# Patient Record
Sex: Male | Born: 1955 | Race: White | Hispanic: No | Smoking: Former smoker
Health system: Southern US, Community
[De-identification: ages and names within clinical notes are randomized; demographics above are authoritative.]

## PROBLEM LIST (undated history)

## (undated) DIAGNOSIS — I2510 Atherosclerotic heart disease of native coronary artery without angina pectoris: Secondary | ICD-10-CM

## (undated) DIAGNOSIS — E785 Hyperlipidemia, unspecified: Secondary | ICD-10-CM

## (undated) DIAGNOSIS — K635 Polyp of colon: Secondary | ICD-10-CM

## (undated) DIAGNOSIS — N649 Disorder of breast, unspecified: Secondary | ICD-10-CM

## (undated) DIAGNOSIS — K5790 Diverticulosis of intestine, part unspecified, without perforation or abscess without bleeding: Secondary | ICD-10-CM

## (undated) DIAGNOSIS — Z7289 Other problems related to lifestyle: Secondary | ICD-10-CM

## (undated) DIAGNOSIS — L309 Dermatitis, unspecified: Secondary | ICD-10-CM

## (undated) HISTORY — DX: Other problems related to lifestyle: Z72.89

## (undated) HISTORY — DX: Hyperlipidemia, unspecified: E78.5

## (undated) HISTORY — DX: Diverticulosis of intestine, part unspecified, without perforation or abscess without bleeding: K57.90

## (undated) HISTORY — DX: Dermatitis, unspecified: L30.9

## (undated) HISTORY — DX: Polyp of colon: K63.5

## (undated) HISTORY — DX: Disorder of breast, unspecified: N64.9

---

## 1986-09-16 HISTORY — PX: LACERATION REPAIR: SHX5168

## 1986-09-16 HISTORY — PX: EXPLORATORY LAPAROTOMY: SUR591

## 2005-06-02 ENCOUNTER — Emergency Department: Payer: Self-pay | Admitting: Emergency Medicine

## 2005-10-15 ENCOUNTER — Encounter: Payer: Self-pay | Admitting: Family Medicine

## 2005-10-15 LAB — CONVERTED CEMR LAB: TSH: 1.432 microintl units/mL

## 2005-11-11 ENCOUNTER — Ambulatory Visit: Payer: Self-pay | Admitting: Family Medicine

## 2005-11-20 ENCOUNTER — Ambulatory Visit: Payer: Self-pay | Admitting: Family Medicine

## 2005-11-20 LAB — CONVERTED CEMR LAB
Blood Glucose, Fasting: 77 mg/dL
TSH: 0.87 microintl units/mL

## 2006-07-18 ENCOUNTER — Ambulatory Visit: Payer: Self-pay | Admitting: Family Medicine

## 2006-11-10 ENCOUNTER — Ambulatory Visit: Payer: Self-pay | Admitting: Family Medicine

## 2006-11-10 LAB — CONVERTED CEMR LAB
ALT: 68 units/L — ABNORMAL HIGH (ref 0–40)
Albumin: 3.8 g/dL (ref 3.5–5.2)
Alkaline Phosphatase: 110 units/L (ref 39–117)
BUN: 14 mg/dL (ref 6–23)
Blood Glucose, Fasting: 96 mg/dL
CO2: 32 meq/L (ref 19–32)
Calcium: 9.4 mg/dL (ref 8.4–10.5)
Direct LDL: 135.2 mg/dL
GFR calc Af Amer: 114 mL/min
GFR calc non Af Amer: 95 mL/min
HDL: 67 mg/dL (ref >39.0)
Potassium: 4.5 meq/L (ref 3.5–5.1)
TSH: 1.51 microintl units/mL
TSH: 1.51 microintl units/mL (ref 0.35–5.50)
Total CHOL/HDL Ratio: 3.4
Total Protein: 6.8 g/dL (ref 6.0–8.3)
Triglycerides: 104 mg/dL (ref 0–149)
VLDL: 21 mg/dL (ref 0–40)

## 2006-11-17 ENCOUNTER — Ambulatory Visit: Payer: Self-pay | Admitting: Family Medicine

## 2006-11-24 ENCOUNTER — Ambulatory Visit: Payer: Self-pay | Admitting: Internal Medicine

## 2006-12-01 ENCOUNTER — Ambulatory Visit: Payer: Self-pay | Admitting: Internal Medicine

## 2006-12-01 DIAGNOSIS — K5730 Diverticulosis of large intestine without perforation or abscess without bleeding: Secondary | ICD-10-CM

## 2006-12-22 ENCOUNTER — Ambulatory Visit: Payer: Self-pay | Admitting: Family Medicine

## 2006-12-22 LAB — CONVERTED CEMR LAB: ALT: 36 units/L (ref 0–40)

## 2007-11-23 ENCOUNTER — Ambulatory Visit: Payer: Self-pay | Admitting: Family Medicine

## 2007-11-25 ENCOUNTER — Encounter: Payer: Self-pay | Admitting: Family Medicine

## 2007-11-25 DIAGNOSIS — J309 Allergic rhinitis, unspecified: Secondary | ICD-10-CM | POA: Insufficient documentation

## 2007-11-25 DIAGNOSIS — L259 Unspecified contact dermatitis, unspecified cause: Secondary | ICD-10-CM

## 2007-11-29 DIAGNOSIS — E7800 Pure hypercholesterolemia, unspecified: Secondary | ICD-10-CM

## 2007-11-29 LAB — CONVERTED CEMR LAB
AST: 25 units/L (ref 0–37)
Albumin: 3.9 g/dL (ref 3.5–5.2)
Alkaline Phosphatase: 92 units/L (ref 39–117)
CO2: 29 meq/L (ref 19–32)
Chloride: 106 meq/L (ref 96–112)
Cholesterol: 222 mg/dL (ref 0–200)
Creatinine, Ser: 0.8 mg/dL (ref 0.4–1.5)
HDL: 54.5 mg/dL (ref >39.0)
PSA: 1.22 ng/mL (ref 0.10–4.00)
Sodium: 140 meq/L (ref 135–145)
Total Bilirubin: 0.6 mg/dL (ref 0.3–1.2)
Total Protein: 7 g/dL (ref 6.0–8.3)
VLDL: 24 mg/dL (ref 0–40)

## 2007-12-07 ENCOUNTER — Ambulatory Visit: Payer: Self-pay | Admitting: Family Medicine

## 2008-12-05 ENCOUNTER — Ambulatory Visit: Payer: Self-pay | Admitting: Family Medicine

## 2008-12-05 LAB — CONVERTED CEMR LAB
Albumin: 3.8 g/dL (ref 3.5–5.2)
Alkaline Phosphatase: 97 units/L (ref 39–117)
Basophils Relative: 0.6 % (ref 0.0–3.0)
Bilirubin, Direct: 0 mg/dL (ref 0.0–0.3)
Calcium: 9.1 mg/dL (ref 8.4–10.5)
Eosinophils Absolute: 0.6 10*3/uL (ref 0.0–0.7)
GFR calc non Af Amer: 107.42 mL/min (ref >60)
HCT: 46.9 % (ref 39.0–52.0)
HDL: 49.7 mg/dL (ref >39.00)
Hemoglobin: 16 g/dL (ref 13.0–17.0)
Lymphocytes Relative: 31.2 % (ref 12.0–46.0)
MCHC: 34.2 g/dL (ref 30.0–36.0)
MCV: 93.6 fL (ref 78.0–100.0)
Neutro Abs: 4.1 10*3/uL (ref 1.4–7.7)
RBC: 5.01 M/uL (ref 4.22–5.81)
Sodium: 143 meq/L (ref 135–145)
Total Bilirubin: 0.7 mg/dL (ref 0.3–1.2)
Total CHOL/HDL Ratio: 4
VLDL: 18.8 mg/dL (ref 0.0–40.0)

## 2008-12-12 ENCOUNTER — Ambulatory Visit: Payer: Self-pay | Admitting: Family Medicine

## 2008-12-12 DIAGNOSIS — R7309 Other abnormal glucose: Secondary | ICD-10-CM | POA: Insufficient documentation

## 2009-07-31 ENCOUNTER — Ambulatory Visit: Payer: Self-pay | Admitting: Family Medicine

## 2009-07-31 DIAGNOSIS — K5732 Diverticulitis of large intestine without perforation or abscess without bleeding: Secondary | ICD-10-CM

## 2009-07-31 LAB — CONVERTED CEMR LAB
Bacteria, UA: 0
Glucose, Urine, Semiquant: NEGATIVE
Nitrite: NEGATIVE
RBC / HPF: 0
Specific Gravity, Urine: 1.015

## 2009-08-07 ENCOUNTER — Ambulatory Visit: Payer: Self-pay | Admitting: Family Medicine

## 2009-12-11 ENCOUNTER — Ambulatory Visit: Payer: Self-pay | Admitting: Family Medicine

## 2009-12-11 LAB — CONVERTED CEMR LAB
Alkaline Phosphatase: 84 units/L (ref 39–117)
Basophils Relative: 0.9 % (ref 0.0–3.0)
Bilirubin, Direct: 0 mg/dL (ref 0.0–0.3)
CO2: 29 meq/L (ref 19–32)
Calcium: 9.3 mg/dL (ref 8.4–10.5)
Creatinine,U: 143.9 mg/dL
Direct LDL: 140.7 mg/dL
Eosinophils Absolute: 0.5 10*3/uL (ref 0.0–0.7)
Eosinophils Relative: 6.4 % — ABNORMAL HIGH (ref 0.0–5.0)
GFR calc non Af Amer: 93.4 mL/min (ref >60)
HDL: 59.5 mg/dL (ref >39.00)
Lymphocytes Relative: 25 % (ref 12.0–46.0)
Microalb, Ur: 1.1 mg/dL (ref 0.0–1.9)
Neutrophils Relative %: 59.4 % (ref 43.0–77.0)
RBC: 4.99 M/uL (ref 4.22–5.81)
Sodium: 141 meq/L (ref 135–145)
Total CHOL/HDL Ratio: 4
VLDL: 17 mg/dL (ref 0.0–40.0)
WBC: 8.4 10*3/uL (ref 4.5–10.5)

## 2009-12-18 ENCOUNTER — Ambulatory Visit: Payer: Self-pay | Admitting: Family Medicine

## 2010-04-24 ENCOUNTER — Encounter (INDEPENDENT_AMBULATORY_CARE_PROVIDER_SITE_OTHER): Payer: Self-pay | Admitting: *Deleted

## 2010-10-16 NOTE — Letter (Signed)
Summary: Nadara Eaton letter  [place] [facility]  [address] [place], [place] [address]   Phone: [phone]  Fax: [phone]       04/24/2010 MRN: [id number]  [facility] [address] [place], [place]  [address]  Dear Mr. [facility] [place] and [place] announce the retirement of Arta Silence, M.D., from full-time practice at the [facility] office effective March 15, 2010 and his plans of returning part-time.  It is important to Dr. Hetty Ely and to our practice that you understand that [facility] has seven physicians in our office for your health care needs.  We will continue to offer the same exceptional care that you have today.    Dr. Hetty Ely has spoken to many of you about his plans for retirement and returning part-time in the fall.   We will continue to work with you through the transition to schedule appointments for you in the office and meet the high standards that [employer] is committed to.   Again, it is with great pleasure that we share the news that Dr. Hetty Ely will return to [facility] at [facility] in October of 2011 with a reduced schedule.    If you have any questions, or would like to request an appointment with one of our physicians, please call us at [phone] and press the option for Scheduling an appointment.  We take pleasure in providing you with excellent patient care and look forward to seeing you at your next office visit.  Our [facility] Physicians are:  Tillman Abide, M.D. Laurita Quint, M.D. Roxy Manns, M.D. Kerby Nora, M.D. Hannah Beat, M.D. Ruthe Mannan, M.D. We proudly welcomed Raechel Ache, M.D. and Eustaquio Boyden, M.D. to the practice in July/August 2011.  Sincerely,  [facility]

## 2010-10-16 NOTE — Assessment & Plan Note (Signed)
Summary: CPX//CYD   Vital Signs:  Patient profile:   55 year old male Weight:      205.75 pounds Temp:     98.4 degrees F oral Pulse rate:   84 / minute Pulse rhythm:   regular BP sitting:   122 / 82  (left arm) Cuff size:   regular  Vitals Entered By: Sydell Axon LPN (December 18, 1608 2:59 PM) CC: 30 Minute checkup, had a colonoscopy in [facility] 03/08   History of Present Illness: Pt here with his wife for Comp Exam. He is currently having LBP from doing too much yesterday.  He has had back discomfort from falling out of a tree when young.  Preventive Screening-Counseling & Management  Alcohol-Tobacco     Alcohol drinks/day: 2     Alcohol type: beer     Smoking Status: quit     Packs/Day: 1994     Year Quit: 1994     Pack years: 20     Passive Smoke Exposure: no  Caffeine-Diet-Exercise     Caffeine use/day: 1     Does Patient Exercise: no     Type of exercise: walks     Times/week: 5  Allergies: 1)  ! * Prednisone 2)  ! 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, ARM, BACKX2 (CONVENIENCE STORE CLERK) (DR Katrinka Blazing) 1988 COLONOSCOPY-- DIVERTICULITIS:(12/01/2006)  Family History: Last updated: 06-Jan-2010 Father: DECEASED 36  BLADDER CANCER/ DM/ CVA Mother: ALIVE  65   HEART DISEASE , MULTIPLE CVA; MI PACER BROTHER EDDIE A 61  ELEVATED CHOLESTEROL  MI BROTHER HALEY A ELEVATED CHOL  CV: +MOTHER MULTIPLE HBP: ?SELF DM: + FATHER GOUT/ARTHRITIS  PROSTATE CANCER: NEGATIVE + BLADDER CANCER FATHER BREAST/OVARIAN/UTERINE CANCER:  COLON CANCER: + METAS TO COLON FATHER DEPRESSION : NEGATIVE ETOH/DRUG ABUSE: NEGATIVE OTHER: + STROKE MOTHER/FATHER  Social History: Last updated: 12/12/2008 Marital Status: Married/Divorced 1997  Children: 2 CHILDREN// 4 GRANDCHILDREN Occupation: HVAC WORK  Risk Factors: Alcohol Use: 2  (January 06, 2010) Caffeine Use: 1 (2010-01-06) Exercise: no (01-06-10)  Risk Factors: Smoking Status: quit (2010/01/06) Packs/Day: 1994 (2010-01-06) Passive Smoke Exposure: no (01-06-2010)  Family History: Father: DECEASED 38  BLADDER CANCER/ DM/ CVA Mother: ALIVE  64   HEART DISEASE , MULTIPLE CVA; MI PACER BROTHER EDDIE A 61  ELEVATED CHOLESTEROL  MI BROTHER HALEY A ELEVATED CHOL  CV: +MOTHER MULTIPLE HBP: ?SELF DM: + FATHER GOUT/ARTHRITIS  PROSTATE CANCER: NEGATIVE + BLADDER CANCER FATHER BREAST/OVARIAN/UTERINE CANCER:  COLON CANCER: + METAS TO COLON FATHER DEPRESSION : NEGATIVE ETOH/DRUG ABUSE: NEGATIVE OTHER: + STROKE MOTHER/FATHER  Social History: Does Patient Exercise:  no  Review of Systems General:  Denies chills, fatigue, fever, sweats, weakness, and weight loss. Eyes:  Complains of blurring; denies discharge and eye pain; occas blurry. ENT:  Complains of decreased hearing; denies earache and ringing in ears. CV:  Denies chest pain or discomfort, fainting, fatigue, shortness of breath with exertion, swelling of feet, and swelling of hands. Resp:  Denies cough, shortness of breath, and wheezing. GI:  Denies abdominal pain, bloody stools, change in bowel habits, constipation, dark tarry stools, diarrhea, indigestion, loss of appetite, nausea, vomiting, vomiting blood, and yellowish skin color. GU:  Denies discharge, dysuria, nocturia, and urinary frequency. MS:  Complains of low back pain; denies muscle aches, cramps, and stiffness; chronic. Derm:  Denies dryness, itching, and rash. Neuro:  Denies numbness, poor balance, tingling, and  tremors.  Physical Exam  General:  Well-developed,well-nourished,in no acute distress; alert,appropriate and cooperative throughout examination, minimally overweight.. Head:  Normocephalic and atraumatic without obvious abnormalities. No apparent alopecia or balding. Sinuses NT. Eyes:  Conjunctiva clear bilaterally.  Ears:  External  ear exam shows no significant lesions or deformities.  Otoscopic examination reveals clear canals, tympanic membranes are intact bilaterally without bulging, retraction, inflammation or discharge. Hearing is grossly normal bilaterally. Nose:  External nasal examination shows no deformity or inflammation. Nasal mucosa are pink and moist without lesions or exudates. Mouth:  Oral mucosa and oropharynx without lesions or exudates.  Teeth in good repair. Neck:  No deformities, masses, or tenderness noted. Chest Wall:  No deformities, masses, tenderness or gynecomastia noted. Breasts:  No masses or gynecomastia noted Lungs:  Normal respiratory effort, chest expands symmetrically. Lungs are clear to auscultation, no crackles or wheezes. Heart:  Normal rate and regular rhythm. S1 and S2 normal without gallop, murmur, click, rub or other extra sounds. Abdomen:  Bowel sounds positive,abdomen soft and non-tender generally  without masses, organomegaly or hernias noted. Well healed vertical abd incision. Mildly protuberant abdomen. Rectal:  No external abnormalities noted. Normal sphincter tone. No rectal masses or tenderness. G neg. Genitalia:  Testes bilaterally descended without nodularity, tenderness or masses. No scrotal masses or lesions. No penis lesions or urethral discharge. Prostate:  Prostate gland firm and smooth, no enlargement, nodularity, tenderness, mass, asymmetry or induration. 20gms. Nonboggy. Msk:  No deformity or scoliosis noted of thoracic or lumbar spine.   Pulses:  R and L carotid,radial,femoral,dorsalis pedis and posterior tibial pulses are full and equal bilaterally Extremities:  No clubbing, cyanosis, edema, or deformity noted with normal full range of motion of all joints.   Neurologic:  No cranial nerve deficits noted. Station and gait are normal. Sensory, motor and coordinative functions appear intact. Skin:  Intact without suspicious lesions or rashes, benign AKs of the shoulder  areas. Cervical Nodes:  No lymphadenopathy noted Inguinal Nodes:  No significant adenopathy Psych:  Cognition and judgment appear intact. Alert and cooperative with normal attention span and concentration. No apparent delusions, illusions, hallucinations   Impression & Recommendations:  Problem # 1:  HEALTH MAINTENANCE EXAM (ICD-V70.0)  Reviewed preventive care protocols, scheduled due services, and updated immunizations. Discussed diet and exercise and healthmaintenance tips. Immuns UTD. Colonoscopy UTD. Avoid sweetsd and carbs and fatty foods as discussed.  Problem # 2:  DIVERTICULITIS OF COLON (ICD-562.11) Assessment: Improved  Back to normal, pain gone and is avopiding seeds and nuts which has made big diff for him.  Colonoscopy:  Labs Reviewed: Hgb: 15.6 (12/11/2009)   Hct: 47.2 (12/11/2009)   WBC: 8.4 (12/11/2009)  Problem # 3:  HYPERGLYCEMIA (ICD-790.29) Assessment: Improved Euglycemic today altho high 90s. Discussed diet and exercisr. Labs Reviewed:eCreat: 0.9 (12/11/2009)     Problem # 4:  HYPERCHOLESTEROLEMIA (ICD-272.0) Assessment: Unchanged LDL slightly high. He can do better and says he will.  Labs Reviewed: SGOT: 25 (12/11/2009)   SGPT: 49 (12/11/2009)   HDL:59.50 (12/11/2009), 49.70 (12/05/2008)  LDL:119 (12/05/2008), DEL (11/23/2007)  Chol:210 (12/11/2009), 187 (12/05/2008)  Trig:85.0 (12/11/2009), 94.0 (12/05/2008)  Patient Instructions: 1)  RTC one year, sooner as needed.  2)  Che ck PSA again next year.  Prior Medications (reviewed today): None Current Allergies (reviewed today): ! * PREDNISONE ! * CODEINE

## 2010-10-25 ENCOUNTER — Encounter: Payer: Self-pay | Admitting: Family Medicine

## 2010-10-31 ENCOUNTER — Ambulatory Visit (INDEPENDENT_AMBULATORY_CARE_PROVIDER_SITE_OTHER): Payer: BC Managed Care – PPO | Admitting: Family Medicine

## 2010-10-31 ENCOUNTER — Encounter: Payer: Self-pay | Admitting: Family Medicine

## 2010-10-31 ENCOUNTER — Other Ambulatory Visit: Payer: Self-pay | Admitting: Family Medicine

## 2010-10-31 DIAGNOSIS — K5732 Diverticulitis of large intestine without perforation or abscess without bleeding: Secondary | ICD-10-CM

## 2010-10-31 LAB — HEPATIC FUNCTION PANEL
ALT: 31 U/L (ref 0–53)
AST: 25 U/L (ref 0–37)
Alkaline Phosphatase: 107 U/L (ref 39–117)
Bilirubin, Direct: 0.1 mg/dL (ref 0.0–0.3)
Total Bilirubin: 1 mg/dL (ref 0.3–1.2)

## 2010-10-31 LAB — BASIC METABOLIC PANEL
BUN: 14 mg/dL (ref 6–23)
Calcium: 9.2 mg/dL (ref 8.4–10.5)
GFR: 99.44 mL/min (ref >60.00)
Glucose, Bld: 87 mg/dL (ref 70–99)
Potassium: 4.4 mEq/L (ref 3.5–5.1)
Sodium: 140 mEq/L (ref 135–145)

## 2010-10-31 LAB — CONVERTED CEMR LAB
Nitrite: NEGATIVE
Protein, U semiquant: 30
Urobilinogen, UA: 0.2
WBC Urine, dipstick: NEGATIVE
pH: 7.5

## 2010-11-01 LAB — CBC WITH DIFFERENTIAL/PLATELET
Basophils Absolute: 0.1 10*3/uL (ref 0.0–0.1)
Eosinophils Relative: 3.1 % (ref 0.0–5.0)
Lymphocytes Relative: 13.7 % (ref 12.0–46.0)
Monocytes Relative: 9.5 % (ref 3.0–12.0)
Platelets: 252 10*3/uL (ref 150.0–400.0)
RDW: 13.9 % (ref 11.5–14.6)
WBC: 11.4 10*3/uL — ABNORMAL HIGH (ref 4.5–10.5)

## 2010-11-07 NOTE — Assessment & Plan Note (Signed)
Summary: ? DIVERTICULITIS   Vital Signs:  Patient profile:   55 year old male Weight:      193.25 pounds BMI:     29.49 Temp:     98.3 degrees F oral Pulse rate:   88 / minute Pulse rhythm:   regular BP sitting:   116 / 80  (left arm) Cuff size:   large  Vitals Entered By: Selena Batten Dance CMA (AAMA) (October 31, 2010 8:55 AM) CC: ? Diverticulitis Comments lower abd pain x1 1/2 days   History of Present Illness: CC: abd pain  3 d h/o lower abdominal pain, intermittent, sharp pain suprapubically.  Pain lasts 30 sec.  Appetite still present.  Last meal was 6pm yesterday, last BM was yesterday.    No fevers/chills, nausea, vomiting, diarrhea, stool changes.  Voiding fine, no burning, no polyuria.  Complaining of hemorrhoid pain x 1 mo.  Did have popcorn the other night (hadn't had in a long time)  has had this in past, last time was 2 years ago feels same, treated as diverticulitis and improved.  no abd surgeries.  h/o colonoscopy 2008 with diverticulitis per chart.  Current Medications (verified): 1)  None  Allergies: 1)  ! * Prednisone 2)  ! 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 Mouth:  Oral mucosa and oropharynx without lesions or exudates.  Teeth in good repair.  MMM Lungs:  Normal respiratory effort, chest expands symmetrically. Lungs are clear to auscultation, no crackles or  wheezes. Heart:  Normal rate and regular rhythm. S1 and S2 normal without gallop, murmur, click, rub or other extra sounds. Abdomen:  Bowel sounds positive,abdomen soft and without masses, organomegaly or hernias noted. Well healed vertical abd incision. Mildly protuberant abdomen.  + tender to palpation LLQ mainly but also suprapubic region. Genitalia:  no inguinal hernias appreciated today Pulses:  2+ rad pulses, brisk cap refill Extremities:  no pedal edema   Impression & Recommendations:  Problem # 1:  DIVERTICULITIS OF COLON (ICD-562.11) Assessment New  leading differential is rpt diverticulitis in pt with h/o same and similar presentation 2 years ago. check CBC (would expect mild WBC elevation).  check LFTs to r/o other intraabd process. UA micro reassuring. discussed clear liquid diet for next 1-2 days then slowly ADAT. treat with abx x2 to prevent complications.  had colonscopy 2008, WNL x divertic.  if flare recurrent, refer back to GI for sooner eval. if not improving or deteriorating, low threshold for abd/pelvic CT w contrast.  no hernias evident today.    Orders: UA Dipstick W/ Micro (manual) (40981) Venipuncture (19147) TLB-BMP (Basic Metabolic Panel-BMET) (80048-METABOL) TLB-CBC Platelet - w/Differential (85025-CBCD) TLB-Hepatic/Liver Function Pnl (80076-HEPATIC)  Colonoscopy:  2008 WNL  Labs Reviewed: Hgb: 15.6 (12/11/2009)   Hct: 47.2 (12/11/2009)   WBC: 8.4 (12/11/2009)  Complete Medication List: 1)  Ciprofloxacin Hcl 500 Mg Tabs (Ciprofloxacin hcl) .... Take one twice daily for 10 days 2)  Metronidazole 500 Mg Tabs (Metronidazole) .... One by mouth three times a day x 10 days  Patient Instructions: 1)  Call [facility] to see when next colonoscopy due. 2)  I think you have some diverticulitis.  Treat with antibiotics for 10 days. 3)  Change to broth/liquid diet for next several days and then slowly advance diet (bland foods). 4)  If fevers, worsening or not  improving as expected, please let us know.  We may pursue further imaging if not improving as expected. 5)  Blood work today to check things. Prescriptions: METRONIDAZOLE 500 MG TABS (METRONIDAZOLE) one by mouth three times a day x 10 days  #30 x 0   Entered and Authorized by:   Eustaquio Boyden  MD   Signed by:   Eustaquio Boyden  MD on 10/31/2010   Method used:   Electronically to        [id number] (retail)       [address]       [place], [place]  [address]       Ph: [phone]       Fax: [phone]   RxID:   [id number] CIPROFLOXACIN HCL 500 MG TABS (CIPROFLOXACIN HCL) take one twice daily for 10 days  #20 x 0   Entered and Authorized by:   Eustaquio Boyden  MD   Signed by:   Eustaquio Boyden  MD on 10/31/2010   Method used:   Electronically to        [id number] (retail)       [address]       [place], [place]  [address]       Ph: [phone]       Fax: [phone]   RxID:   [id number]    Orders Added: 1)  UA Dipstick W/ Micro (manual) [81000] 2)  Venipuncture [48546] 3)  TLB-BMP (Basic Metabolic Panel-BMET) [80048-METABOL] 4)  TLB-CBC Platelet - w/Differential [85025-CBCD] 5)  TLB-Hepatic/Liver Function Pnl [80076-HEPATIC] 6)  Est. Patient Level III [[address]]    Prior Medications: Current Allergies (reviewed today): ! * PREDNISONE ! * CODEINE  Laboratory Results   Urine Tests  Date/Time Received: October 31, 2010 9:03 AM  Date/Time Reported: October 31, 2010 9:03 AM   Routine Urinalysis   Color: amber Appearance: Clear Glucose: negative   (Normal Range: Negative) Bilirubin: negative   (Normal Range: Negative) Ketone: small (15)   (Normal Range: Negative) Spec. Gravity: 1.015   (Normal Range: 1.003-1.035) Blood: trace-lysed   (Normal Range: Negative) pH: 7.5   (Normal Range: 5.0-8.0) Protein: 30   (Normal Range: Negative) Urobilinogen: 0.2   (Normal Range: 0-1) Nitrite: negative   (Normal Range:  Negative) Leukocyte Esterace: negative   (Normal Range: Negative)  Urine Microscopic WBC/HPF: 1-5 RBC/HPF: 0-3 Bacteria/HPF: tr Mucous/HPF: large Epithelial/HPF: rare Crystals/HPF: no Casts/LPF: no Yeast/HPF: no    Comments: read by .........Eustaquio Boyden  MD  October 31, 2010 9:29 AM  no UCx sent.

## 2010-12-13 ENCOUNTER — Other Ambulatory Visit: Payer: Self-pay | Admitting: Family Medicine

## 2010-12-13 DIAGNOSIS — R7309 Other abnormal glucose: Secondary | ICD-10-CM

## 2010-12-13 DIAGNOSIS — K5730 Diverticulosis of large intestine without perforation or abscess without bleeding: Secondary | ICD-10-CM

## 2010-12-13 DIAGNOSIS — E7800 Pure hypercholesterolemia, unspecified: Secondary | ICD-10-CM

## 2010-12-13 DIAGNOSIS — Z125 Encounter for screening for malignant neoplasm of prostate: Secondary | ICD-10-CM

## 2010-12-17 ENCOUNTER — Other Ambulatory Visit (INDEPENDENT_AMBULATORY_CARE_PROVIDER_SITE_OTHER): Payer: BC Managed Care – PPO | Admitting: Family Medicine

## 2010-12-17 DIAGNOSIS — K5730 Diverticulosis of large intestine without perforation or abscess without bleeding: Secondary | ICD-10-CM

## 2010-12-17 DIAGNOSIS — R7309 Other abnormal glucose: Secondary | ICD-10-CM

## 2010-12-17 DIAGNOSIS — E7800 Pure hypercholesterolemia, unspecified: Secondary | ICD-10-CM

## 2010-12-17 DIAGNOSIS — Z125 Encounter for screening for malignant neoplasm of prostate: Secondary | ICD-10-CM

## 2010-12-17 LAB — CBC WITH DIFFERENTIAL/PLATELET
Basophils Absolute: 0.1 10*3/uL (ref 0.0–0.1)
Eosinophils Absolute: 0.4 10*3/uL (ref 0.0–0.7)
Lymphocytes Relative: 17 % (ref 12.0–46.0)
MCHC: 33.9 g/dL (ref 30.0–36.0)
Neutrophils Relative %: 71.6 % (ref 43.0–77.0)
RDW: 14 % (ref 11.5–14.6)

## 2010-12-17 LAB — BASIC METABOLIC PANEL
CO2: 26 mEq/L (ref 19–32)
Calcium: 8.9 mg/dL (ref 8.4–10.5)
Creatinine, Ser: 0.5 mg/dL (ref 0.4–1.5)
Glucose, Bld: 101 mg/dL — ABNORMAL HIGH (ref 70–99)

## 2010-12-17 LAB — LIPID PANEL
Cholesterol: 216 mg/dL — ABNORMAL HIGH (ref 0–200)
Total CHOL/HDL Ratio: 4
Triglycerides: 81 mg/dL (ref 0.0–149.0)
VLDL: 16.2 mg/dL (ref 0.0–40.0)

## 2010-12-17 LAB — HEPATIC FUNCTION PANEL
Albumin: 3.7 g/dL (ref 3.5–5.2)
Alkaline Phosphatase: 98 U/L (ref 39–117)
Bilirubin, Direct: 0.1 mg/dL (ref 0.0–0.3)

## 2010-12-20 ENCOUNTER — Encounter: Payer: Self-pay | Admitting: Family Medicine

## 2010-12-20 ENCOUNTER — Ambulatory Visit (INDEPENDENT_AMBULATORY_CARE_PROVIDER_SITE_OTHER): Payer: BC Managed Care – PPO | Admitting: Family Medicine

## 2010-12-20 DIAGNOSIS — M7041 Prepatellar bursitis, right knee: Secondary | ICD-10-CM | POA: Insufficient documentation

## 2010-12-20 DIAGNOSIS — M7040 Prepatellar bursitis, unspecified knee: Secondary | ICD-10-CM

## 2010-12-20 DIAGNOSIS — E7800 Pure hypercholesterolemia, unspecified: Secondary | ICD-10-CM

## 2010-12-20 DIAGNOSIS — K5732 Diverticulitis of large intestine without perforation or abscess without bleeding: Secondary | ICD-10-CM

## 2010-12-20 DIAGNOSIS — R7309 Other abnormal glucose: Secondary | ICD-10-CM

## 2010-12-20 DIAGNOSIS — K5730 Diverticulosis of large intestine without perforation or abscess without bleeding: Secondary | ICD-10-CM

## 2010-12-20 DIAGNOSIS — J309 Allergic rhinitis, unspecified: Secondary | ICD-10-CM

## 2010-12-20 NOTE — Progress Notes (Signed)
  Subjective:    Patient ID: Adam Daniels, male    DOB: May 10, 1956, 55 y.o.   MRN: [id number]  HPI Pt here with wife for COMP Exam. He woke up SUN AM with his right knee acutely swollen with discomfort. He otherwise feels well with no other complaints.    Review of Systems  Constitutional: Negative for fever, chills, diaphoresis, appetite change, fatigue and unexpected weight change.  HENT: Negative for hearing loss, ear pain, tinnitus and ear discharge.   Eyes: Negative for pain, discharge and visual disturbance.  Respiratory: Positive for cough (in AM after Listerine). Negative for shortness of breath and wheezing.   Cardiovascular: Negative for chest pain and palpitations.       [No SOB w/ exertion Gastrointestinal: Negative for nausea, vomiting, abdominal pain, diarrhea, constipation and blood in stool.       [No heartburn or swallowing problems. Genitourinary: Negative for dysuria, frequency and difficulty urinating.       [No nocturia Musculoskeletal: Negative for myalgias, back pain and arthralgias.  Skin: Negative for rash.       [No itching but  dryness. Neurological: Negative for tremors and numbness.       [No tingling or balance problems. Hematological: Negative for adenopathy. Does not bruise/bleed easily.  Psychiatric/Behavioral: Negative for dysphoric mood and agitation.       Objective:   Physical Exam  Constitutional: He is oriented to person, place, and time. He appears well-developed and well-nourished. No distress.  HENT:  Head: Normocephalic and atraumatic.  Right Ear: External ear normal.  Left Ear: External ear normal.  Nose: Nose normal.  Mouth/Throat: Oropharynx is clear and moist.  Eyes: Conjunctivae and EOM are normal. Pupils are equal, round, and reactive to light. Right eye exhibits no discharge. Left eye exhibits no discharge. No scleral icterus.  Neck: Normal range of motion. Neck supple. No thyromegaly present.  Cardiovascular: Normal rate,  regular rhythm, normal heart sounds and intact distal pulses.   No murmur heard. Pulmonary/Chest: Effort normal and breath sounds normal. No respiratory distress. He has no wheezes.  Abdominal: Soft. Bowel sounds are normal. He exhibits no distension and no mass. There is no tenderness. There is no rebound and no guarding.  Genitourinary: Rectum normal, prostate normal and penis normal. Guaiac negative stool.       Prostate 20gms  Musculoskeletal: Normal range of motion. He exhibits no edema.       R knee with bursa mildly swollen without erythema and warmth. Minimal tenderness to palpation and no broken skin evident.  Lymphadenopathy:    He has no cervical adenopathy.  Neurological: He is alert and oriented to person, place, and time. Coordination normal.  Skin: Skin is warm and dry. No rash noted. He is not diaphoretic.  Psychiatric: He has a normal mood and affect. His behavior is normal. Judgment and thought content normal.          Assessment & Plan:  Comp Exam   Normal.

## 2010-12-20 NOTE — Assessment & Plan Note (Signed)
Acute. Discussed taking Aleve 2 after brfst and supper for 2 weeks.

## 2010-12-20 NOTE — Assessment & Plan Note (Addendum)
LDL slightly high, o/w good. Do not think worth starting med at this point. Avoid fatty foods. Was discussed. Lab Results  Component Value Date   CHOL 216* 12/17/2010   CHOL 210* 12/11/2009   CHOL 187 12/05/2008   Lab Results  Component Value Date   HDL 57.90 12/17/2010   HDL 59.50 12/11/2009   HDL 09.81 12/05/2008   Lab Results  Component Value Date   LDLCALC 119* 12/05/2008   Lab Results  Component Value Date   TRIG 81.0 12/17/2010   TRIG 85.0 12/11/2009   TRIG 94.0 12/05/2008   Lab Results  Component Value Date   CHOLHDL 4 12/17/2010   CHOLHDL 4 12/11/2009   CHOLHDL 4 12/05/2008

## 2010-12-20 NOTE — Assessment & Plan Note (Signed)
Discussed being seen for prolonged LLQ discomfort..he now understands why.

## 2010-12-20 NOTE — Assessment & Plan Note (Signed)
Essentially resolved. 

## 2010-12-20 NOTE — Assessment & Plan Note (Addendum)
Minimally elevated. Discussed avoiding sweets and carbs....given specifics. Start ASA 81mg  per day.

## 2010-12-20 NOTE — Assessment & Plan Note (Signed)
Stable and well controlled. 

## 2011-11-15 HISTORY — PX: COLONOSCOPY: SHX174

## 2011-11-20 ENCOUNTER — Encounter: Payer: Self-pay | Admitting: Internal Medicine

## 2011-11-21 ENCOUNTER — Ambulatory Visit (AMBULATORY_SURGERY_CENTER): Payer: BC Managed Care – PPO | Admitting: *Deleted

## 2011-11-21 ENCOUNTER — Encounter: Payer: Self-pay | Admitting: Internal Medicine

## 2011-11-21 VITALS — Ht 67.0 in | Wt 199.5 lb

## 2011-11-21 DIAGNOSIS — Z1211 Encounter for screening for malignant neoplasm of colon: Secondary | ICD-10-CM

## 2011-11-21 MED ORDER — PEG-KCL-NACL-NASULF-NA ASC-C 100 G PO SOLR: ORAL | Status: DC

## 2011-12-05 ENCOUNTER — Ambulatory Visit (AMBULATORY_SURGERY_CENTER): Payer: BC Managed Care – PPO | Admitting: Internal Medicine

## 2011-12-05 ENCOUNTER — Encounter: Payer: Self-pay | Admitting: Internal Medicine

## 2011-12-05 VITALS — BP 118/90 | HR 64 | Temp 96.4°F | Resp 14 | Ht 67.0 in | Wt 199.0 lb

## 2011-12-05 DIAGNOSIS — Z800 Family history of malignant neoplasm of digestive organs: Secondary | ICD-10-CM

## 2011-12-05 DIAGNOSIS — D126 Benign neoplasm of colon, unspecified: Secondary | ICD-10-CM

## 2011-12-05 DIAGNOSIS — Z1211 Encounter for screening for malignant neoplasm of colon: Secondary | ICD-10-CM

## 2011-12-05 DIAGNOSIS — K5730 Diverticulosis of large intestine without perforation or abscess without bleeding: Secondary | ICD-10-CM

## 2011-12-05 MED ORDER — SODIUM CHLORIDE 0.9 % IV SOLN: 500.0000 mL | INTRAVENOUS | Status: DC

## 2011-12-05 NOTE — Progress Notes (Signed)
Patient did not experience any of the following events: a burn prior to discharge; a fall within the facility; wrong site/side/patient/procedure/implant event; or a hospital transfer or hospital admission upon discharge from the facility. (G8907) Patient did not have preoperative order for IV antibiotic SSI prophylaxis. (G8918)  

## 2011-12-05 NOTE — Op Note (Signed)
[facility] [address] [place], [place]  [address]  COLONOSCOPY PROCEDURE REPORT  PATIENT:  Adam Daniels, Adam Daniels  MR#:  [id number] BIRTHDATE:  [birth year], 56 yrs. old  GENDER:  male ENDOSCOPIST:  Iva Boop, MD, [facility] REF. BY: PROCEDURE DATE:  12/05/2011 PROCEDURE:  Colonoscopy with snare polypectomy ASA CLASS:  Class I INDICATIONS:  Elevated Risk Screening, family history of colon cancer father had colon cancer (colostomy and chemotx) in early 60's MEDICATIONS:   These medications were titrated to patient response per physician's verbal order, Fentanyl 75 mcg IV, Versed 7 mg IV  DESCRIPTION OF PROCEDURE:   After the risks benefits and alternatives of the procedure were thoroughly explained, informed consent was obtained.  Digital rectal exam was performed and revealed no abnormalities and normal prostate.   The LB [id number]-[id number] endoscope was introduced through the anus and advanced to the cecum, which was identified by both the appendix (photo omitted) and ileocecal valve, without limitations.  The patient was moved supine to fully enter cecum. The quality of the prep was good, using MoviPrep.  The instrument was then slowly withdrawn as the colon was fully examined. <<PROCEDUREIMAGES>>  FINDINGS:  Two polyps were found in the transverse colon. They were diminutive. Polyps were snared without cautery. Retrieval was successful.  Moderate diverticulosis was found in the left colon. This was otherwise a normal examination of the colon.   Retroflexed views in the rectum revealed no abnormalities.    The time to cecum = 2:52 minutes. The scope was then withdrawn in 13:28 minutes from the cecum and the procedure completed. COMPLICATIONS:  None ENDOSCOPIC IMPRESSION: 1) Two polyps in the transverse colon - diminutive and removed 2) Moderate diverticulosis in the left colon 3) Otherwise normal examination, good prep,  with family hx colon cancer  REPEAT EXAM:  In  for Colonoscopy, pending biopsy results.  Iva Boop, MD, [facility]  CC:  The Patient  n. eSIGNED:   Iva Boop at 12/05/2011 09:50 AM  Pamalee Leyden, [phone]

## 2011-12-05 NOTE — Patient Instructions (Signed)

## 2011-12-06 ENCOUNTER — Telehealth: Payer: Self-pay | Admitting: *Deleted

## 2011-12-06 NOTE — Telephone Encounter (Signed)
  Follow up Call-  Call back number 12/05/2011  Post procedure Call Back phone  # [phone]  Permission to leave phone message Yes     Patient questions:  Do you have a fever, pain , or abdominal swelling? no Pain Score  0 *  Have you tolerated food without any problems? yes  Have you been able to return to your normal activities? yes  Do you have any questions about your discharge instructions: Diet   no Medications  no Follow up visit  no  Do you have questions or concerns about your Care? no  Actions: * If pain score is 4 or above: No action needed, pain <4.

## 2011-12-11 ENCOUNTER — Encounter: Payer: Self-pay | Admitting: Internal Medicine

## 2011-12-11 ENCOUNTER — Encounter: Payer: Self-pay | Admitting: Family Medicine

## 2011-12-11 DIAGNOSIS — Z860100 Personal history of colon polyps, unspecified: Secondary | ICD-10-CM | POA: Insufficient documentation

## 2011-12-11 NOTE — Progress Notes (Signed)
Quick Note:  Diminutive adenoma and Fhx CRCA Repeat colon about 11/2016 ______

## 2011-12-20 ENCOUNTER — Other Ambulatory Visit: Payer: Self-pay | Admitting: Family Medicine

## 2011-12-20 DIAGNOSIS — Z125 Encounter for screening for malignant neoplasm of prostate: Secondary | ICD-10-CM

## 2011-12-20 DIAGNOSIS — E7800 Pure hypercholesterolemia, unspecified: Secondary | ICD-10-CM

## 2011-12-23 ENCOUNTER — Other Ambulatory Visit (INDEPENDENT_AMBULATORY_CARE_PROVIDER_SITE_OTHER): Payer: BC Managed Care – PPO

## 2011-12-23 DIAGNOSIS — Z125 Encounter for screening for malignant neoplasm of prostate: Secondary | ICD-10-CM

## 2011-12-23 DIAGNOSIS — E7800 Pure hypercholesterolemia, unspecified: Secondary | ICD-10-CM

## 2011-12-23 LAB — BASIC METABOLIC PANEL
Chloride: 106 mEq/L (ref 96–112)
GFR: 89.27 mL/min (ref >60.00)
Potassium: 4.2 mEq/L (ref 3.5–5.1)
Sodium: 141 mEq/L (ref 135–145)

## 2011-12-23 LAB — LIPID PANEL
Cholesterol: 207 mg/dL — ABNORMAL HIGH (ref 0–200)
HDL: 52.6 mg/dL (ref >39.00)
Triglycerides: 88 mg/dL (ref 0.0–149.0)

## 2011-12-26 ENCOUNTER — Encounter: Payer: Self-pay | Admitting: Family Medicine

## 2011-12-26 ENCOUNTER — Ambulatory Visit (INDEPENDENT_AMBULATORY_CARE_PROVIDER_SITE_OTHER): Payer: BC Managed Care – PPO | Admitting: Family Medicine

## 2011-12-26 ENCOUNTER — Encounter: Payer: BC Managed Care – PPO | Admitting: Family Medicine

## 2011-12-26 VITALS — BP 112/80 | HR 76 | Temp 97.7°F | Ht 68.0 in | Wt 201.0 lb

## 2011-12-26 DIAGNOSIS — E7800 Pure hypercholesterolemia, unspecified: Secondary | ICD-10-CM

## 2011-12-26 DIAGNOSIS — R7309 Other abnormal glucose: Secondary | ICD-10-CM

## 2011-12-26 DIAGNOSIS — R21 Rash and other nonspecific skin eruption: Secondary | ICD-10-CM | POA: Insufficient documentation

## 2011-12-26 DIAGNOSIS — L209 Atopic dermatitis, unspecified: Secondary | ICD-10-CM | POA: Insufficient documentation

## 2011-12-26 DIAGNOSIS — Z0000 Encounter for general adult medical examination without abnormal findings: Secondary | ICD-10-CM | POA: Insufficient documentation

## 2011-12-26 MED ORDER — CLOTRIMAZOLE-BETAMETHASONE 1-0.05 % EX CREA: TOPICAL_CREAM | Freq: Two times a day (BID) | CUTANEOUS | Status: DC

## 2011-12-26 MED ORDER — CLOTRIMAZOLE 1 % EX CREA: TOPICAL_CREAM | Freq: Two times a day (BID) | CUTANEOUS | Status: DC

## 2011-12-26 NOTE — Assessment & Plan Note (Signed)
Again elevated - discussed decreased sweets/carbs. Monitor , consider A1c next blood draw.

## 2011-12-26 NOTE — Patient Instructions (Addendum)
Use lotrisone (combination steroid and antifungal) daily for 2 wks on spots on feet and right pinky, then use only clotrimazole. Use only clotrimazole on nipple. Call me in 2 weeks with an update - if not improving, will likely refer to derm. Bad cholesterol was too high - work on weight loss and increase soy, beans, barley, lentils, oats and plant sterol ester enriched margarine instead of butter. Good to see you today, call us with questions. Return in 1 year or as needed.

## 2011-12-26 NOTE — Assessment & Plan Note (Addendum)
3 different rashes: Feet - anticipate tinea pedis, does have some scaling of soles.  Treat with lotrisone x2 wks then just clotrimazole.  If not better update [place]. Right 5th digit - ?dyshydrotic eczema vs fungal - treat with lotrisone x2 wks as well. L areola - unclear etiology.  Treat with clotrimazole, if not improving refer to derm. Discussed skin thinning with steroid cream.

## 2011-12-26 NOTE — Assessment & Plan Note (Signed)
Reviewed blood work. Discussed goal <130, ideally <100 given. Discussed dietary changes.

## 2011-12-26 NOTE — Progress Notes (Signed)
Subjective:    Patient ID: Adam Daniels, male    DOB: [birth year], 56 y.o.   MRN: [id number]  HPI CC: CPE  Presents with wife for annual physical.  H/o allergic rhinitis.  Allergic to hardwoods and dogs.  Some concerns: Foot problem - right medial ankle with rash.  Present 4-5 yrs.  Waxes and wanes.  Staying constant.  Has seen Dr. Orson Aloe - dermatology - treated with cortisone shot and jug of some cream.  No other skin rashes except on right pink finger - dry skin rash, itchy - present as well for about 4-5 years. Rash on left areola - itchy for 6 months.  Caffeine: 1 cup coffee/day Lives with wife, 2 dogs Occupation: heating and air Activity: slacked off going to [employer], does walk in woods Diet: fruits/vegetables daily, good water, low starch  Preventative: Last CPE last year. Had colonoscopy 11/2011 - diverticulosis and adenomatous polyp, rpt due 5 yrs Leone Payor) Normal prostate exams in past and PSA. Td - 2007 Flu - did receive.  Medications and allergies reviewed and updated in chart.  Past histories reviewed and updated if relevant as below. Patient Active Problem List  Diagnoses  . HYPERCHOLESTEROLEMIA  . ALLERGIC RHINITIS  . DIVERTICULOSIS OF COLON  . CONTACT DERMATITIS&OTHER ECZEMA DUE UNSPEC CAUSE  . HYPERGLYCEMIA  . Bursitis, prepatellar, right  . Personal history of colonic polyps-adenoma   Past Medical History  Diagnosis Date  . Allergic rhinitis 01/07  . Diverticulosis     by colonsocpy   Past Surgical History  Procedure Date  . 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 Colonoscopy 11/2011    tubular adenoma, mod diverticulosis, fmhx colon CA Leone Payor) rpt 52yrs   History  Substance Use Topics  . Smoking status: Former Smoker    Types: Cigarettes    Quit date: 11/20/1992  . Smokeless tobacco: Never Used   Comment: quit 17-18 years ago  . Alcohol Use: 5.0 oz/week    10 drink(s) per week     occassionally  beer   Family History  Problem Relation Age of Onset  . Heart disease Mother     MI pacer  . Stroke Mother   . Cancer Father     bladder  . Diabetes Father   . Stroke Father   . Hyperlipidemia Brother   . Heart disease Brother     MI  . Hyperlipidemia Brother   . Stomach cancer Neg Hx   . Colon cancer Neg Hx    Allergies  Allergen Reactions  . Codeine     REACTION: NAUSEA AND VOMITING  . Prednisone     REACTION: WHELPS   Current Outpatient Prescriptions on File Prior to Visit  Medication Sig Dispense Refill  . aspirin 81 MG tablet Take 81 mg by mouth daily.         Review of Systems  Constitutional: Negative for fever, chills, activity change, appetite change, fatigue and unexpected weight change.  HENT: Negative for hearing loss and neck pain.   Eyes: Negative for visual disturbance.  Respiratory: Negative for cough, chest tightness, shortness of breath and wheezing.   Cardiovascular: Negative for chest pain, palpitations and leg swelling.  Gastrointestinal: Negative for nausea, vomiting, abdominal pain, diarrhea, constipation, blood in stool and abdominal distention.  Genitourinary: Negative for hematuria and difficulty urinating.  Musculoskeletal: Negative for myalgias and arthralgias.  Skin: Negative for rash.  Neurological: Negative for dizziness, seizures, syncope and headaches.  Hematological: Does not bruise/bleed easily.  Psychiatric/Behavioral: Negative for dysphoric mood. The patient is not nervous/anxious.        Objective:   Physical Exam  Nursing note and vitals reviewed. Constitutional: He is oriented to person, place, and time. He appears well-developed and well-nourished. No distress.  HENT:  Head: Normocephalic and atraumatic.  Right Ear: External ear normal.  Left Ear: External ear normal.  Nose: Nose normal.  Mouth/Throat: Oropharynx is clear and moist. No oropharyngeal exudate.  Eyes: Conjunctivae and EOM are normal. Pupils are equal,  round, and reactive to light. No scleral icterus.  Neck: Normal range of motion. Neck supple. No thyromegaly present.  Cardiovascular: Normal rate, regular rhythm, normal heart sounds and intact distal pulses.   No murmur heard. Pulses:      Radial pulses are 2+ on the right side, and 2+ on the left side.  Pulmonary/Chest: Effort normal and breath sounds normal. No respiratory distress. He has no wheezes. He has no rales.  Abdominal: Soft. Bowel sounds are normal. He exhibits no distension and no mass. There is no tenderness. There is no rebound and no guarding.  Genitourinary: Rectum normal and prostate normal. Rectal exam shows no external hemorrhoid, no internal hemorrhoid, no fissure, no mass, no tenderness and anal tone normal. Prostate is not enlarged and not tender.  Musculoskeletal: Normal range of motion. He exhibits no edema.  Lymphadenopathy:    He has no cervical adenopathy.  Neurological: He is alert and oriented to person, place, and time.       CN grossly intact, station and gait intact  Skin: Skin is warm and dry. No rash noted.  Psychiatric: He has a normal mood and affect. His behavior is normal. Judgment and thought content normal.   R medial ankle - scaly, pruritic, hyperkeratotic, erythematous well demarcated rash, not painful. R dorsal 5th finger - scaly hyperkeratotic rash throughout entire finger, pruritic.  Slight papular L areola - superior medial component thickened, pruritic.    Assessment & Plan:

## 2011-12-26 NOTE — Assessment & Plan Note (Signed)
Preventative protocols updated and reviewed. Td 2007 Colonoscopy 11/2011, adenomatous polyp, rpt due 5 yrs. Discussed healthy diet and lifestyle.

## 2012-02-07 ENCOUNTER — Encounter: Payer: Self-pay | Admitting: Family Medicine

## 2012-02-07 ENCOUNTER — Ambulatory Visit (INDEPENDENT_AMBULATORY_CARE_PROVIDER_SITE_OTHER): Payer: BC Managed Care – PPO | Admitting: Family Medicine

## 2012-02-07 VITALS — BP 114/68 | HR 80 | Temp 97.8°F | Wt 197.0 lb

## 2012-02-07 DIAGNOSIS — N649 Disorder of breast, unspecified: Secondary | ICD-10-CM

## 2012-02-07 DIAGNOSIS — N6489 Other specified disorders of breast: Secondary | ICD-10-CM

## 2012-02-07 HISTORY — DX: Disorder of breast, unspecified: N64.9

## 2012-02-07 NOTE — Patient Instructions (Signed)
Pass by Adam Daniels's office for referral to dermatologist. You may need biopsy.

## 2012-02-07 NOTE — Progress Notes (Signed)
  Subjective:    Patient ID: Adam Daniels, male    DOB: 1956/02/19, 56 y.o.   MRN: [id number]  HPI CC: nipple inflammation  Seen here last month with left nipple lesion.  present for 6 months.  Thought possible tineal infection (had tineal infection in other areas of body) so treated with clotrimazole, but didn't improve at all.  Now more tender - described as sharp stabbing pain, burning, erythematous and swollen.  No bleeding.  Some drainage.  Come and goes - recently was more swollen, red and painful.  No fevers/chills, not spreading.  Medications and allergies reviewed and updated in chart.  Past histories reviewed and updated if relevant as below. Patient Active Problem List  Diagnoses  . HYPERCHOLESTEROLEMIA  . ALLERGIC RHINITIS  . DIVERTICULOSIS OF COLON  . CONTACT DERMATITIS&OTHER ECZEMA DUE UNSPEC CAUSE  . HYPERGLYCEMIA  . Bursitis, prepatellar, right  . Personal history of colonic polyps-adenoma  . Healthcare maintenance  . Skin rash   Past Medical History  Diagnosis Date  . Allergic rhinitis 01/07  . Diverticulosis     by colonsocpy  . HLD (hyperlipidemia)   . Hyperglycemia   . Colon polyp    Past Surgical History  Procedure Date  . 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 Colonoscopy 11/2011    tubular adenoma, mod diverticulosis, fmhx colon CA Leone Payor) rpt 16yrs   History  Substance Use Topics  . Smoking status: Former Smoker    Types: Cigarettes    Quit date: 11/20/1992  . Smokeless tobacco: Never Used   Comment: quit 17-18 years ago  . Alcohol Use: 5.0 oz/week    10 drink(s) per week     occassionally beer   Family History  Problem Relation Age of Onset  . Heart disease Mother     MI pacer  . Stroke Mother   . Cancer Father     bladder  . Diabetes Father   . Stroke Father   . Hyperlipidemia Brother   . Heart disease Brother     MI  . Hyperlipidemia Brother   . Stomach cancer Neg Hx   . Colon  cancer Neg Hx    Allergies  Allergen Reactions  . Codeine     REACTION: NAUSEA AND VOMITING  . Prednisone     REACTION: WHELPS   Current Outpatient Prescriptions on File Prior to Visit  Medication Sig Dispense Refill  . aspirin 81 MG tablet Take 81 mg by mouth daily.      . clotrimazole (LOTRIMIN) 1 % cream Apply topically 2 (two) times daily.  45 g  0  . clotrimazole-betamethasone (LOTRISONE) cream Apply topically 2 (two) times daily.  30 g  0     Review of Systems Per HPI    Objective:   Physical Exam  Nursing note and vitals reviewed. Skin:       Left nipple medially with some scaling as well as erythema and tenderness to touch.  Areola asymmetric and larger medially compared to right nipple.  Lesion somewhat raised compared to rest of skin. I do not appreciate fluctuance or induration today. No discharge or nipple inversion.   Unable to appreciate abnormality deeper than skin. No gynecomastia.       Assessment & Plan:

## 2012-02-08 ENCOUNTER — Encounter: Payer: Self-pay | Admitting: Family Medicine

## 2012-02-08 NOTE — Assessment & Plan Note (Addendum)
present for 6 mo, not healing. ?paget disease of breast vs eczema or other.  Doubt infection.  No systemic sxs, no underlying mass palpable. Will refer to derm as may need biopsy of areola.

## 2012-02-11 ENCOUNTER — Ambulatory Visit: Payer: BC Managed Care – PPO | Admitting: Family Medicine

## 2012-02-15 DIAGNOSIS — L309 Dermatitis, unspecified: Secondary | ICD-10-CM

## 2012-02-15 HISTORY — DX: Dermatitis, unspecified: L30.9

## 2012-03-12 ENCOUNTER — Encounter: Payer: Self-pay | Admitting: Family Medicine

## 2012-05-19 ENCOUNTER — Encounter: Payer: Self-pay | Admitting: Family Medicine

## 2012-05-19 ENCOUNTER — Ambulatory Visit (INDEPENDENT_AMBULATORY_CARE_PROVIDER_SITE_OTHER): Payer: BC Managed Care – PPO | Admitting: Family Medicine

## 2012-05-19 VITALS — BP 118/82 | HR 76 | Temp 98.1°F | Wt 198.0 lb

## 2012-05-19 DIAGNOSIS — N6489 Other specified disorders of breast: Secondary | ICD-10-CM

## 2012-05-19 DIAGNOSIS — N649 Disorder of breast, unspecified: Secondary | ICD-10-CM

## 2012-05-19 DIAGNOSIS — K644 Residual hemorrhoidal skin tags: Secondary | ICD-10-CM

## 2012-05-19 MED ORDER — HYDROCORTISONE 2.5 % RE CREA: TOPICAL_CREAM | Freq: Two times a day (BID) | RECTAL | Status: AC

## 2012-05-19 NOTE — Patient Instructions (Addendum)
I think you have external hemorrhoid. Treat with warm water soaks/bath and anusol HC cream to bottom. If not improving with this, let me know.  Hemorrhoids Hemorrhoids are enlarged (dilated) veins around the rectum. There are 2 types of hemorrhoids, and the type of hemorrhoid is determined by its location. Internal hemorrhoids occur in the veins just inside the rectum.They are usually not painful, but they may bleed.However, they may poke through to the outside and become irritated and painful. External hemorrhoids involve the veins outside the anus and can be felt as a painful swelling or hard lump near the anus.They are often itchy and may crack and bleed. Sometimes clots will form in the veins. This makes them swollen and painful. These are called thrombosed hemorrhoids. CAUSES Causes of hemorrhoids include:  Pregnancy. This increases the pressure in the hemorrhoidal veins.   Constipation.   Straining to have a bowel movement.   Obesity.   Heavy lifting or other activity that caused you to strain.  TREATMENT Most of the time hemorrhoids improve in 1 to 2 weeks. However, if symptoms do not seem to be getting better or if you have a lot of rectal bleeding, your caregiver may perform a procedure to help make the hemorrhoids get smaller or remove them completely.Possible treatments include:  Rubber band ligation. A rubber band is placed at the base of the hemorrhoid to cut off the circulation.   Sclerotherapy. A chemical is injected to shrink the hemorrhoid.   Infrared light therapy. Tools are used to burn the hemorrhoid.   Hemorrhoidectomy. This is surgical removal of the hemorrhoid.  HOME CARE INSTRUCTIONS   Increase fiber in your diet. Ask your caregiver about using fiber supplements.   Drink enough water and fluids to keep your urine clear or pale yellow.   Exercise regularly.   Go to the bathroom when you have the urge to have a bowel movement. Do not wait.   Avoid  straining to have bowel movements.   Keep the anal area dry and clean.   Only take over-the-counter or prescription medicines for pain, discomfort, or fever as directed by your caregiver.  If your hemorrhoids are thrombosed:  Take warm sitz baths for 20 to 30 minutes, 3 to 4 times per day.   If the hemorrhoids are very tender and swollen, place ice packs on the area as tolerated. Using ice packs between sitz baths may be helpful. Fill a plastic bag with ice. Place a towel between the bag of ice and your skin.   Medicated creams and suppositories may be used or applied as directed.   Do not use a donut-shaped pillow or sit on the toilet for long periods. This increases blood pooling and pain.  SEEK MEDICAL CARE IF:   You have increasing pain and swelling that is not controlled with your medicine.   You have uncontrolled bleeding.   You have difficulty or you are unable to have a bowel movement.   You have pain or inflammation outside the area of the hemorrhoids.   You have chills or an oral temperature above 102 F (38.9 C).  MAKE SURE YOU:   Understand these instructions.   Will watch your condition.   Will get help right away if you are not doing well or get worse.  Document Released: 08/30/2000 Document Revised: 08/22/2011 Document Reviewed: 01/05/2008 [facility] Patient Information 2012 [place], [place].

## 2012-05-19 NOTE — Progress Notes (Signed)
  Subjective:    Patient ID: Adam Daniels, male    DOB: Jan 26, 1956, 56 y.o.   MRN: [id number]  HPI CC: rectal bleeding "i think i have hemorrhoid problem"  Felt lump on left bottom.  Tender with defecation, tender with wiping.  Mild bleeding with wiping.  No blood in stool or commode.  Painful to sit.  sxs going on for the last 5 days.    Having 1 soft stool/day.  Good water, good fiber in diet.    Hasn't tried anything for bottom.  COLONOSCOPY Date: 11/2011 tubular adenoma, mod diverticulosis, fmhx colon CA Leone Payor) rpt 84yrs  Past Medical History  Diagnosis Date  . Allergic rhinitis 01/07  . Diverticulosis     by colonsocpy  . HLD (hyperlipidemia)   . Hyperglycemia   . Colon polyp   . Nipple dermatitis 02/2012    trial of clobetasol  Terri Piedra)     Review of Systems Per HPI    Objective:   Physical Exam  Nursing note and vitals reviewed. Constitutional: He appears well-developed and well-nourished. No distress.  Genitourinary: Rectal exam shows external hemorrhoid (left anterior ext hemorrhoid swollen, not thrombosed). Rectal exam shows no internal hemorrhoid, no fissure, no mass, no tenderness and anal tone normal.       Assessment & Plan:

## 2012-05-19 NOTE — Assessment & Plan Note (Signed)
Non thrombosed. See pt instructions.

## 2012-05-19 NOTE — Assessment & Plan Note (Signed)
Resolved with Dr. Dorita Sciara treatment.

## 2012-05-21 ENCOUNTER — Ambulatory Visit: Payer: BC Managed Care – PPO | Admitting: Family Medicine

## 2012-12-16 ENCOUNTER — Other Ambulatory Visit: Payer: Self-pay | Admitting: Family Medicine

## 2012-12-16 DIAGNOSIS — R7309 Other abnormal glucose: Secondary | ICD-10-CM

## 2012-12-16 DIAGNOSIS — Z125 Encounter for screening for malignant neoplasm of prostate: Secondary | ICD-10-CM

## 2012-12-16 DIAGNOSIS — E7800 Pure hypercholesterolemia, unspecified: Secondary | ICD-10-CM

## 2012-12-18 ENCOUNTER — Other Ambulatory Visit (INDEPENDENT_AMBULATORY_CARE_PROVIDER_SITE_OTHER): Payer: BC Managed Care – PPO

## 2012-12-18 DIAGNOSIS — Z125 Encounter for screening for malignant neoplasm of prostate: Secondary | ICD-10-CM

## 2012-12-18 DIAGNOSIS — E7800 Pure hypercholesterolemia, unspecified: Secondary | ICD-10-CM

## 2012-12-18 DIAGNOSIS — R7309 Other abnormal glucose: Secondary | ICD-10-CM

## 2012-12-18 LAB — LIPID PANEL
HDL: 38.1 mg/dL — ABNORMAL LOW (ref >39.00)
LDL Cholesterol: 120 mg/dL — ABNORMAL HIGH (ref 0–99)
Total CHOL/HDL Ratio: 5
VLDL: 17.6 mg/dL (ref 0.0–40.0)

## 2012-12-18 LAB — BASIC METABOLIC PANEL
Calcium: 8.9 mg/dL (ref 8.4–10.5)
GFR: 107.38 mL/min (ref >60.00)
Glucose, Bld: 100 mg/dL — ABNORMAL HIGH (ref 70–99)
Potassium: 4.6 mEq/L (ref 3.5–5.1)
Sodium: 136 mEq/L (ref 135–145)

## 2012-12-25 ENCOUNTER — Encounter: Payer: Self-pay | Admitting: Family Medicine

## 2012-12-25 ENCOUNTER — Ambulatory Visit (INDEPENDENT_AMBULATORY_CARE_PROVIDER_SITE_OTHER): Payer: BC Managed Care – PPO | Admitting: Family Medicine

## 2012-12-25 VITALS — BP 124/76 | HR 92 | Temp 97.6°F | Ht 68.5 in | Wt 205.0 lb

## 2012-12-25 DIAGNOSIS — K5730 Diverticulosis of large intestine without perforation or abscess without bleeding: Secondary | ICD-10-CM

## 2012-12-25 DIAGNOSIS — Z0000 Encounter for general adult medical examination without abnormal findings: Secondary | ICD-10-CM

## 2012-12-25 MED ORDER — AMOXICILLIN-POT CLAVULANATE 875-125 MG PO TABS: 1.0000 | ORAL_TABLET | Freq: Two times a day (BID) | ORAL | Status: DC

## 2012-12-25 NOTE — Patient Instructions (Signed)
Good to see you today, call us with questions. Start walking or going back to gym for weight loss.  Goal of at least 10 lbs

## 2012-12-25 NOTE — Assessment & Plan Note (Signed)
What sounds like episode of diverticulitis last few weeks, responded to flagyl treatment.  Requests antibiotic to hold, provided augmentin script, discussed needs to come in to be seen if rpt flare.  Reviewed symptoms to monitor for.

## 2012-12-25 NOTE — Assessment & Plan Note (Signed)
Preventative protocols reviewed and updated unless pt declined. Discussed healthy diet and lifestyle.  DRE/PSA reassuring  UTD immunizations and colonoscopy.

## 2012-12-25 NOTE — Progress Notes (Signed)
Subjective:    Patient ID: Adam Daniels, male    DOB: [birth year], 57 y.o.   MRN: [id number]  HPI CC: annual exam  Body mass index is 30.71 kg/(m^2).  Wt Readings from Last 3 Encounters:  12/25/12 205 lb (92.987 kg)  05/19/12 198 lb (89.812 kg)  02/07/12 197 lb (89.359 kg)    Stopped aspirin - recommended restart. Took flagyl a few weeks ago for lower abd pain and 1 wk of constipation.  Took daughter's flagyl for 3-4 days and sxs resolved. This was first abd pain in >1 year. H/o moderate diverticulosis with diverticulitis in past.   No fevers/chills with this.  Caffeine: 1 cup coffee/day  Lives with wife, 2 dogs  Occupation: heating and air  Activity: slacked off going to [employer], does walk in woods  Diet: fruits/vegetables daily, good water, low starch   Preventative:  Had colonoscopy 11/2011 - diverticulosis and adenomatous polyp, rpt due 5 yrs Leone Payor)  Normal prostate exams in past and PSA. No fmhx prostate cancer.  No nocturia, strong stream. Td - 2007  Flu - did receive at work  Medications and allergies reviewed and updated in chart.  Past histories reviewed and updated if relevant as below. Patient Active Problem List  Diagnosis  . HYPERCHOLESTEROLEMIA  . ALLERGIC RHINITIS  . DIVERTICULOSIS OF COLON  . CONTACT DERMATITIS&OTHER ECZEMA DUE UNSPEC CAUSE  . HYPERGLYCEMIA  . Bursitis, prepatellar, right  . Personal history of colonic polyps-adenoma  . Healthcare maintenance  . Skin rash  . Nipple lesion  . External hemorrhoid   Past Medical History  Diagnosis Date  . Allergic rhinitis 01/07  . Diverticulosis     by colonsocpy  . HLD (hyperlipidemia)   . Hyperglycemia   . Colon polyp   . Nipple dermatitis 02/2012    trial of clobetasol  Terri Piedra)   Past Surgical History  Procedure Laterality Date  . 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 Colonoscopy  11/2011    tubular adenoma, mod diverticulosis, fmhx colon CA  Leone Payor) rpt 74yrs   History  Substance Use Topics  . Smoking status: Former Smoker    Types: Cigarettes    Quit date: 11/20/1992  . Smokeless tobacco: Never Used     Comment: quit 17-18 years ago  . Alcohol Use: 5.0 oz/week    10 drink(s) per week     Comment: occassionally beer   Family History  Problem Relation Age of Onset  . Heart disease Mother     MI pacer  . Stroke Mother   . Cancer Father     bladder  . Diabetes Father   . Stroke Father   . Hyperlipidemia Brother   . Heart disease Brother     MI  . Hyperlipidemia Brother   . Stomach cancer Neg Hx   . Colon cancer Neg Hx    Allergies  Allergen Reactions  . Codeine     REACTION: NAUSEA AND VOMITING  . Prednisone     REACTION: WHELPS   Current Outpatient Prescriptions on File Prior to Visit  Medication Sig Dispense Refill  . aspirin 81 MG tablet Take 81 mg by mouth daily.       No current facility-administered medications on file prior to visit.     Review of Systems  Constitutional: Negative for fever, chills, activity change, appetite change, fatigue and unexpected weight change.  HENT: Negative for hearing loss and neck pain.  Eyes: Negative for visual disturbance.  Respiratory: Negative for cough, chest tightness, shortness of breath and wheezing.   Cardiovascular: Negative for chest pain, palpitations and leg swelling.  Gastrointestinal: Positive for abdominal pain and constipation. Negative for nausea, vomiting, diarrhea, blood in stool and abdominal distention.  Genitourinary: Negative for hematuria and difficulty urinating.  Musculoskeletal: Negative for myalgias and arthralgias.  Skin: Negative for rash.  Neurological: Negative for dizziness, seizures, syncope and headaches.  Hematological: Negative for adenopathy. Does not bruise/bleed easily.  Psychiatric/Behavioral: Negative for dysphoric mood. The patient is not nervous/anxious.        Objective:   Physical Exam  Nursing note and vitals  reviewed. Constitutional: He is oriented to person, place, and time. He appears well-developed and well-nourished. No distress.  HENT:  Head: Normocephalic and atraumatic.  Right Ear: Hearing, tympanic membrane, external ear and ear canal normal.  Left Ear: Hearing, tympanic membrane, external ear and ear canal normal.  Nose: Nose normal.  Mouth/Throat: Oropharynx is clear and moist. No oropharyngeal exudate.  Eyes: Conjunctivae and EOM are normal. Pupils are equal, round, and reactive to light. No scleral icterus.  Neck: Normal range of motion. Neck supple. Carotid bruit is not present.  Cardiovascular: Normal rate, regular rhythm, normal heart sounds and intact distal pulses.   No murmur heard. Pulses:      Radial pulses are 2+ on the right side, and 2+ on the left side.  Pulmonary/Chest: Effort normal and breath sounds normal. No respiratory distress. He has no wheezes. He has no rales.  Abdominal: Soft. Bowel sounds are normal. He exhibits no distension and no mass. There is no tenderness. There is no rebound and no guarding.  Genitourinary: Rectum normal and prostate normal. Rectal exam shows no external hemorrhoid, no internal hemorrhoid, no fissure, no mass, no tenderness and anal tone normal. Prostate is not enlarged (15-20gm) and not tender.  Musculoskeletal: Normal range of motion. He exhibits no edema.  Lymphadenopathy:    He has no cervical adenopathy.  Neurological: He is alert and oriented to person, place, and time.  CN grossly intact, station and gait intact  Skin: Skin is warm and dry. No rash noted.  Psychiatric: He has a normal mood and affect. His behavior is normal. Judgment and thought content normal.       Assessment & Plan:

## 2014-02-17 ENCOUNTER — Other Ambulatory Visit: Payer: BC Managed Care – PPO

## 2014-02-22 ENCOUNTER — Encounter: Payer: BC Managed Care – PPO | Admitting: Family Medicine

## 2014-03-04 ENCOUNTER — Other Ambulatory Visit: Payer: Self-pay | Admitting: Family Medicine

## 2014-03-04 ENCOUNTER — Other Ambulatory Visit (INDEPENDENT_AMBULATORY_CARE_PROVIDER_SITE_OTHER): Payer: BC Managed Care – PPO

## 2014-03-04 DIAGNOSIS — E7800 Pure hypercholesterolemia, unspecified: Secondary | ICD-10-CM

## 2014-03-04 DIAGNOSIS — R7309 Other abnormal glucose: Secondary | ICD-10-CM

## 2014-03-04 DIAGNOSIS — Z0000 Encounter for general adult medical examination without abnormal findings: Secondary | ICD-10-CM

## 2014-03-04 DIAGNOSIS — Z125 Encounter for screening for malignant neoplasm of prostate: Secondary | ICD-10-CM

## 2014-03-04 LAB — LIPID PANEL
CHOL/HDL RATIO: 4
Cholesterol: 234 mg/dL — ABNORMAL HIGH (ref 0–200)
HDL: 52.6 mg/dL (ref >39.00)
LDL CALC: 158 mg/dL — AB (ref 0–99)
NonHDL: 181.4
Triglycerides: 116 mg/dL (ref 0.0–149.0)
VLDL: 23.2 mg/dL (ref 0.0–40.0)

## 2014-03-04 LAB — BASIC METABOLIC PANEL
BUN: 18 mg/dL (ref 6–23)
CALCIUM: 9.3 mg/dL (ref 8.4–10.5)
CO2: 29 meq/L (ref 19–32)
CREATININE: 0.8 mg/dL (ref 0.4–1.5)
Chloride: 105 mEq/L (ref 96–112)
GFR: 101 mL/min (ref >60.00)
Glucose, Bld: 95 mg/dL (ref 70–99)
Potassium: 4.5 mEq/L (ref 3.5–5.1)
Sodium: 139 mEq/L (ref 135–145)

## 2014-03-04 LAB — PSA: PSA: 1.44 ng/mL (ref 0.10–4.00)

## 2014-03-04 LAB — TSH: TSH: 1.41 u[IU]/mL (ref 0.35–4.50)

## 2014-03-11 ENCOUNTER — Encounter: Payer: BC Managed Care – PPO | Admitting: Family Medicine

## 2014-03-17 ENCOUNTER — Encounter: Payer: Self-pay | Admitting: Family Medicine

## 2014-03-17 ENCOUNTER — Ambulatory Visit (INDEPENDENT_AMBULATORY_CARE_PROVIDER_SITE_OTHER): Payer: BC Managed Care – PPO | Admitting: Family Medicine

## 2014-03-17 VITALS — BP 116/66 | HR 54 | Temp 97.8°F | Ht 68.0 in | Wt 200.0 lb

## 2014-03-17 DIAGNOSIS — E7800 Pure hypercholesterolemia, unspecified: Secondary | ICD-10-CM

## 2014-03-17 DIAGNOSIS — R7309 Other abnormal glucose: Secondary | ICD-10-CM

## 2014-03-17 DIAGNOSIS — K5730 Diverticulosis of large intestine without perforation or abscess without bleeding: Secondary | ICD-10-CM

## 2014-03-17 DIAGNOSIS — Z0000 Encounter for general adult medical examination without abnormal findings: Secondary | ICD-10-CM

## 2014-03-17 NOTE — Assessment & Plan Note (Signed)
Improved this year 

## 2014-03-17 NOTE — Patient Instructions (Addendum)
Low cholesterol diet handout provided today. Work on lowering cholesterol levels. Good to see you today, call us with questions. Return as needed or in 1 year for next physical

## 2014-03-17 NOTE — Assessment & Plan Note (Signed)
currently asxs.

## 2014-03-17 NOTE — Progress Notes (Signed)
BP 116/66  Pulse 54  Temp(Src) 97.8 F (36.6 C) (Oral)  Ht 5\' 8"  (1.727 m)  Wt 200 lb (90.719 kg)  BMI 30.42 kg/m2  SpO2 97%   CC: CPE  Subjective:    Patient ID: Adam Daniels, male    DOB: 1956/01/31, 58 y.o.   MRN: [id number]  HPI: Adam Daniels is a 58 y.o. male presenting on 03/17/2014 for Annual Exam   Wt Readings from Last 3 Encounters:  03/17/14 200 lb (90.719 kg)  12/25/12 205 lb (92.987 kg)  05/19/12 198 lb (89.812 kg)   Body mass index is 30.42 kg/(m^2).  H/o mod diverticulosis with diverticulitis in the past. Has not needed flagyl recently. Strong h/o CAD. Goal LDL <100. Denies claudication sxs, denies ED issues.  Preventative: COLONOSCOPY Date: 11/2011 tubular adenoma, mod diverticulosis, fmhx colon CA [name]) rpt 20yrs Prostate cancer screening - normal prostate exams in past along with normal PSA. No fmhx prostate cancer. No nocturia, has strong stream. Denies ED trouble. Discussed screening, decided to defer DRE today. May consider in future. Flu - does receive at work Td - 2007  Due for eye exam. Sunscreen use discussed. Denies changing moles. Seat belt use discussed.  Caffeine: 1 cup coffee/day  Lives with wife, 2 dogs  Occupation: heating and air  Activity: slacked off going to [employer], does walk in woods  Diet: fruits/vegetables daily, good water, low starch   Relevant past medical, surgical, family and social history reviewed and updated as indicated.  Allergies and medications reviewed and updated. No current outpatient prescriptions on file prior to visit.   No current facility-administered medications on file prior to visit.    Review of Systems  Constitutional: Negative for fever, chills, activity change, appetite change, fatigue and unexpected weight change.  HENT: Negative for hearing loss.   Eyes: Negative for visual disturbance.  Respiratory: Negative for cough, chest tightness, shortness of breath and wheezing.   Cardiovascular:  Negative for chest pain, palpitations and leg swelling.  Gastrointestinal: Positive for blood in stool (with wiping and in stool). Negative for nausea, vomiting, abdominal pain, diarrhea, constipation and abdominal distention.       Denies h/o hemorrhoids  Genitourinary: Negative for hematuria and difficulty urinating.  Musculoskeletal: Negative for arthralgias, myalgias and neck pain.  Skin: Negative for rash.  Neurological: Negative for dizziness, seizures, syncope and headaches.  Hematological: Negative for adenopathy. Does not bruise/bleed easily.  Psychiatric/Behavioral: Negative for dysphoric mood. The patient is not nervous/anxious.    Per HPI unless specifically indicated above    Objective:    BP 116/66  Pulse 54  Temp(Src) 97.8 F (36.6 C) (Oral)  Ht 5\' 8"  (1.727 m)  Wt 200 lb (90.719 kg)  BMI 30.42 kg/m2  SpO2 97%  Physical Exam  Nursing note and vitals reviewed. Constitutional: He is oriented to person, place, and time. He appears well-developed and well-nourished. No distress.  HENT:  Head: Normocephalic and atraumatic.  Right Ear: Hearing, tympanic membrane, external ear and ear canal normal.  Left Ear: Hearing, tympanic membrane, external ear and ear canal normal.  Nose: Nose normal.  Mouth/Throat: Uvula is midline, oropharynx is clear and moist and mucous membranes are normal. No oropharyngeal exudate, posterior oropharyngeal edema or posterior oropharyngeal erythema.  Eyes: Conjunctivae and EOM are normal. Pupils are equal, round, and reactive to light. No scleral icterus.  Neck: Normal range of motion. Neck supple. Carotid bruit is not present. No thyromegaly present.  Cardiovascular: Normal rate, regular rhythm, normal  heart sounds and intact distal pulses.   No murmur heard. Pulses:      Radial pulses are 2+ on the right side, and 2+ on the left side.  Pulmonary/Chest: Effort normal and breath sounds normal. No respiratory distress. He has no wheezes. He has  no rales.  Abdominal: Soft. Bowel sounds are normal. He exhibits no distension and no mass. There is no tenderness. There is no rebound and no guarding.  Genitourinary:  Prostate exam deferred per pt request  Musculoskeletal: Normal range of motion. He exhibits no edema.  Lymphadenopathy:    He has no cervical adenopathy.  Neurological: He is alert and oriented to person, place, and time.  CN grossly intact, station and gait intact  Skin: Skin is warm and dry. No rash noted.  Psychiatric: He has a normal mood and affect. His behavior is normal. Judgment and thought content normal.   Results for orders placed in visit on 03/04/14  LIPID PANEL      Result Value Ref Range   Cholesterol 234 (*) 0 - 200 mg/dL   Triglycerides 116.0  0.0 - 149.0 mg/dL   HDL 52.60  >39.00 mg/dL   VLDL 23.2  0.0 - 40.0 mg/dL   LDL Cholesterol 158 (*) 0 - 99 mg/dL   Total CHOL/HDL Ratio 4     NonHDL 181.40    TSH      Result Value Ref Range   TSH 1.41  0.35 - 4.50 uIU/mL  PSA      Result Value Ref Range   PSA 1.44  0.10 - 4.00 ng/mL  BASIC METABOLIC PANEL      Result Value Ref Range   Sodium 139  135 - 145 mEq/L   Potassium 4.5  3.5 - 5.1 mEq/L   Chloride 105  96 - 112 mEq/L   CO2 29  19 - 32 mEq/L   Glucose, Bld 95  70 - 99 mg/dL   BUN 18  6 - 23 mg/dL   Creatinine, Ser 0.8  0.4 - 1.5 mg/dL   Calcium 9.3  8.4 - 10.5 mg/dL   GFR 101.00  >60.00 mL/min      Assessment & Plan:   Problem List Items Addressed This Visit   HYPERGLYCEMIA     Improved this year.    HYPERCHOLESTEROLEMIA     Goal LDL <100 given fmhx Reviewed #s with patient, low chol diet handout provided today. Recheck next year. Consider statin if not improved with lifestyle changes discussed today.    Healthcare maintenance - Primary     Preventative protocols reviewed and updated unless pt declined. Discussed healthy diet and lifestyle. Declines DRE today, may consider in future.    DIVERTICULOSIS OF COLON     currently  asxs.        Follow up plan: Return in about 1 year (around 03/18/2015), or as needed, for annual exam, prior fasting for blood work.

## 2014-03-17 NOTE — Progress Notes (Signed)
Pre visit review using our clinic review tool, if applicable. No additional management support is needed unless otherwise documented below in the visit note. 

## 2014-03-17 NOTE — Assessment & Plan Note (Signed)
Goal LDL <100 given fmhx Reviewed #s with patient, low chol diet handout provided today. Recheck next year. Consider statin if not improved with lifestyle changes discussed today.

## 2014-03-17 NOTE — Assessment & Plan Note (Signed)
Preventative protocols reviewed and updated unless pt declined. Discussed healthy diet and lifestyle. Declines DRE today, may consider in future.

## 2015-03-23 ENCOUNTER — Other Ambulatory Visit: Payer: BC Managed Care – PPO

## 2015-03-27 ENCOUNTER — Encounter: Payer: BC Managed Care – PPO | Admitting: Family Medicine

## 2015-04-15 ENCOUNTER — Other Ambulatory Visit: Payer: Self-pay | Admitting: Family Medicine

## 2015-04-15 DIAGNOSIS — Z125 Encounter for screening for malignant neoplasm of prostate: Secondary | ICD-10-CM

## 2015-04-15 DIAGNOSIS — E7800 Pure hypercholesterolemia, unspecified: Secondary | ICD-10-CM

## 2015-04-17 ENCOUNTER — Other Ambulatory Visit: Payer: BC Managed Care – PPO

## 2015-04-28 ENCOUNTER — Encounter: Payer: BC Managed Care – PPO | Admitting: Family Medicine

## 2015-05-17 ENCOUNTER — Telehealth: Payer: Self-pay | Admitting: Family Medicine

## 2015-05-17 NOTE — Telephone Encounter (Signed)
PLEASE NOTE: All timestamps contained within this report are represented as [place] Standard Time. CONFIDENTIALTY NOTICE: This fax transmission is intended only for the addressee. It contains information that is legally privileged, confidential or otherwise protected from use or disclosure. If you are not the intended recipient, you are strictly prohibited from reviewing, disclosing, copying using or disseminating any of this information or taking any action in reliance on or regarding this information. If you have received this fax in error, please notify us immediately by telephone so that we can arrange for its return to [place]. Phone: [phone], Toll-Free: [phone], Fax: [phone] Page: 1 of 2 Call Id: [id number] Star Patient Name: Adam Daniels Gender: Male DOB: 09/21/1955 Age: 59 Y 16 M Return Phone Number: [phone] (Primary), [phone] (Secondary) Address: City/State/Zip: [place] Client [facility] - Client Client Site [place], [place] Type Call Call Type Triage / Clinical Caller Name Feliciano Wynter Relationship To Patient Spouse Appointment Disposition EMR Appointment Scheduled Info pasted into Epic Yes Return Phone Number [phone] (Secondary) Chief Complaint Blood In Stool Initial Comment Caller states she was trying to get an appointment because he has blood in his stool. PreDisposition Call Doctor Nurse Assessment Nurse: Amalia Hailey, RN, Lenna Sciara Date/Time Eilene Ghazi Time): 05/12/2015 4:33:40 PM Confirm and document reason for call. If symptomatic, describe symptoms. ---Caller states she was trying to get an appointment because he has blood in his stool. Has the patient traveled out of the country within the last 30 days? ---Not Applicable Does the patient require triage? ---Yes Related  visit to physician within the last 2 weeks? ---No Does the PT have any chronic conditions? (i.e. diabetes, asthma, etc.) ---No Guidelines Guideline Title Affirmed Question Affirmed Notes Nurse Date/Time Eilene Ghazi Time) Rectal Bleeding MILD rectal bleeding (more than just a few drops or streaks) Amalia Hailey, RN, Melissa 05/12/2015 4:34:44 PM Disp. Time Eilene Ghazi Time) Disposition Final User 05/12/2015 4:29:18 PM Send To RN Personal Amalia Hailey, RN, Melissa 05/12/2015 4:43:29 PM See PCP When Office is Open (within 3 days) Yes Amalia Hailey, RN, Emelia Salisbury Understands: Yes Disagree/Comply: Comply PLEASE NOTE: All timestamps contained within this report are represented as [place] Standard Time. CONFIDENTIALTY NOTICE: This fax transmission is intended only for the addressee. It contains information that is legally privileged, confidential or otherwise protected from use or disclosure. If you are not the intended recipient, you are strictly prohibited from reviewing, disclosing, copying using or disseminating any of this information or taking any action in reliance on or regarding this information. If you have received this fax in error, please notify us immediately by telephone so that we can arrange for its return to [place]. Phone: [phone], Toll-Free: [phone], Fax: [phone] Page: 2 of 2 Call Id: [id number] Care Advice Given Per Guideline SEE PCP WITHIN 3 DAYS: * You need to be seen within 2 or 3 days. Call your doctor during regular office hours and make an appointment. An urgent care center is often the best source of care if your doctor's office is closed or you can't get an appointment. NOTE: If office will be open tomorrow, tell caller to call then, not in 3 days. WARM SALINE SITZ BATHS TWICE DAILY FOR RECTAL SYMPTOMS: * Sit in a warm sitz bath for 20 minutes twice a day. This will decrease swelling and irritation, keep the area clean, and help with healing. * Afterwards, pat area dry with  unscented  toilet paper. TO SOFTEN STOOLS AND TREAT CONSTIPATION: * Eat a high fiber diet. * Drink adequate liquids (6-8 glasses of water a day) HIGH FIBER DIET- A high fiber diet will help improve your intestinal function and soften your BM's. The fiber works by holding more water in your stools: * Try to eat fresh fruit and vegetables at each meal (peas, prunes, citrus, apples, beans, corn). * Eat more grain foods (bran flakes, bran muffins, graham crackers, oatmeal, brown rice, and whole wheat bread). Popcorn is a source of fiber. HYDROCORTISONE OINTMENT FOR RECTAL ITCHING: * After SITZ bath and drying your rectal area, apply 1% hydrocortisone ointment (OTC) 2 times a day to reduce irritation. CALL BACK IF: * You become worse. * Bleeding increases * Dizziness occurs After Care Instructions Given Call Event Type User Date / Time Description Comments User: Colin Ina, RN Date/Time Eilene Ghazi Time): 05/12/2015 4:28:55 PM Caller reports she is not with her husband. Asked to give her five minutes before calling him back at the number [phone]. User: Colin Ina, RN Date/Time Eilene Ghazi Time): 05/12/2015 4:56:01 PM Caller declined the appt for Monday and requested an appt for Friday, Sept. 2nd because he reports "off that day". Caller informed of appt @ 8:15am on Sept. 2nd scheduled. Caller advised to call back anytime if his symptoms should get worse or concerns. Caller verb. understood information given.

## 2015-05-19 ENCOUNTER — Encounter: Payer: Self-pay | Admitting: Family Medicine

## 2015-05-19 ENCOUNTER — Ambulatory Visit (INDEPENDENT_AMBULATORY_CARE_PROVIDER_SITE_OTHER): Payer: BC Managed Care – PPO | Admitting: Family Medicine

## 2015-05-19 VITALS — BP 122/80 | HR 77 | Temp 97.6°F | Wt 211.0 lb

## 2015-05-19 DIAGNOSIS — K625 Hemorrhage of anus and rectum: Secondary | ICD-10-CM | POA: Insufficient documentation

## 2015-05-19 LAB — POC HEMOCCULT BLD/STL (OFFICE/1-CARD/DIAGNOSTIC): Fecal Occult Blood, POC: NEGATIVE

## 2015-05-19 LAB — CBC WITH DIFFERENTIAL/PLATELET
BASOS PCT: 0.7 % (ref 0.0–3.0)
Basophils Absolute: 0 10*3/uL (ref 0.0–0.1)
Eosinophils Absolute: 0.6 10*3/uL (ref 0.0–0.7)
Eosinophils Relative: 9 % — ABNORMAL HIGH (ref 0.0–5.0)
HEMATOCRIT: 46.8 % (ref 39.0–52.0)
Hemoglobin: 15.7 g/dL (ref 13.0–17.0)
LYMPHS ABS: 1.6 10*3/uL (ref 0.7–4.0)
LYMPHS PCT: 21.8 % (ref 12.0–46.0)
MCHC: 33.6 g/dL (ref 30.0–36.0)
MCV: 92.5 fl (ref 78.0–100.0)
MONOS PCT: 9 % (ref 3.0–12.0)
Monocytes Absolute: 0.6 10*3/uL (ref 0.1–1.0)
NEUTROS ABS: 4.2 10*3/uL (ref 1.4–7.7)
NEUTROS PCT: 59.5 % (ref 43.0–77.0)
PLATELETS: 270 10*3/uL (ref 150.0–400.0)
RBC: 5.06 Mil/uL (ref 4.22–5.81)
RDW: 13.9 % (ref 11.5–15.5)
WBC: 7.1 10*3/uL (ref 4.0–10.5)

## 2015-05-19 NOTE — Progress Notes (Signed)
Pre visit review using our clinic review tool, if applicable. No additional management support is needed unless otherwise documented below in the visit note. 

## 2015-05-19 NOTE — Progress Notes (Signed)
   BP 122/80 mmHg  Pulse 77  Temp(Src) 97.6 F (36.4 C) (Oral)  Wt 211 lb (95.709 kg)  SpO2 94%   CC: blood in stool  Subjective:    Patient ID: Adam Daniels, male    DOB: 10-04-1955, 59 y.o.   MRN: [id number]  HPI: Adam Daniels is a 59 y.o. male presenting on 05/19/2015 for Blood In Stools   Over last 2 weeks noticing very loose stools with bright red blood in toilet as well as blood with wiping. Increased stool frequency (2-3x/day). Denies fevers/chills, abd pain, nausea, constiptaion. No recent travel, wife not sick. Wonders if this is a hemorrhoid. Unsure if currently he has a hemorrhoid. Last blood with wiping was this morning.   Intermittent blood with wiping and in stool. H/o mod diverticulosis with diverticulitis in the past. No recent diverticulitis flares.   COLONOSCOPY Date: 11/2011 tubular adenoma, mod diverticulosis, rpt 35yrs due to fmhx colon CA [name])   Relevant past medical, surgical, family and social history reviewed and updated as indicated. Interim medical history since our last visit reviewed. Allergies and medications reviewed and updated. No current outpatient prescriptions on file prior to visit.   No current facility-administered medications on file prior to visit.    Review of Systems Per HPI unless specifically indicated above     Objective:    BP 122/80 mmHg  Pulse 77  Temp(Src) 97.6 F (36.4 C) (Oral)  Wt 211 lb (95.709 kg)  SpO2 94%  Wt Readings from Last 3 Encounters:  05/19/15 211 lb (95.709 kg)  03/17/14 200 lb (90.719 kg)  12/25/12 205 lb (92.987 kg)    Physical Exam  Constitutional: He appears well-developed and well-nourished. No distress.  HENT:  Mouth/Throat: Oropharynx is clear and moist. No oropharyngeal exudate.  Cardiovascular: Normal rate, regular rhythm, normal heart sounds and intact distal pulses.   No murmur heard. Pulmonary/Chest: Effort normal and breath sounds normal. No respiratory distress. He has no wheezes.  He has no rales.  Abdominal: Soft. Normal appearance and bowel sounds are normal. He exhibits no distension and no mass. There is no hepatosplenomegaly. There is no tenderness. There is no rigidity, no rebound, no guarding, no CVA tenderness and negative Murphy's sign.  Genitourinary: Rectum normal and prostate normal. Rectal exam shows no external hemorrhoid, no internal hemorrhoid, no fissure, no mass, no tenderness and anal tone normal. Guaiac negative stool. Prostate is not enlarged (20gm) and not tender.  No stool in vault  Musculoskeletal: He exhibits no edema.  Skin: Skin is warm and dry. No rash noted.  Psychiatric: He has a normal mood and affect.  Nursing note and vitals reviewed.  Results for orders placed or performed in visit on 05/19/15  POC Hemoccult Bld/Stl (1-Cd Office Dx)  Result Value Ref Range   Card #1 Date 9 2 16     Fecal Occult Blood, POC Negative Negative      Assessment & Plan:   Problem List Items Addressed This Visit    Rectal bleeding - Primary    No int/ext hemorrhoids. Anticipate diverticular bleed without diverticulosis. Colonoscopy UTD. Check CBC today. Discussed clear liquid diet for 1 day then bland diet over weekend, update [place] on Tues. If persistent bleed, will refer to GI. Pt/wife agree with plan.      Relevant Orders   POC Hemoccult Bld/Stl (1-Cd Office Dx) (Completed)   CBC with Differential/Platelet       Follow up plan: No Follow-up on file.

## 2015-05-19 NOTE — Patient Instructions (Signed)
No hemorrhoids seen today. I think this bleeding is from diverticulosis. Treat with clear liquid diet for 1 day then bland diet over weekend and into Monday. Call me on Tuesday with update - if persistent bleed we will refer you to GI again. Watch for fever or abdominal pain - let us know if that happens.  Diverticulosis Diverticulosis is the condition that develops when small pouches (diverticula) form in the wall of your colon. Your colon, or large intestine, is where water is absorbed and stool is formed. The pouches form when the inside layer of your colon pushes through weak spots in the outer layers of your colon. CAUSES  No one knows exactly what causes diverticulosis. RISK FACTORS  Being older than 46. Your risk for this condition increases with age. Diverticulosis is rare in people younger than 40 years. By age 76, almost everyone has it.  Eating a low-fiber diet.  Being frequently constipated.  Being overweight.  Not getting enough exercise.  Smoking.  Taking over-the-counter pain medicines, like aspirin and ibuprofen. SYMPTOMS  Most people with diverticulosis do not have symptoms. DIAGNOSIS  Because diverticulosis often has no symptoms, health care providers often discover the condition during an exam for other colon problems. In many cases, a health care provider will diagnose diverticulosis while using a flexible scope to examine the colon (colonoscopy). TREATMENT  If you have never developed an infection related to diverticulosis, you may not need treatment. If you have had an infection before, treatment may include:  Eating more fruits, vegetables, and grains.  Taking a fiber supplement.  Taking a live bacteria supplement (probiotic).  Taking medicine to relax your colon. HOME CARE INSTRUCTIONS   Drink at least 6-8 glasses of water each day to prevent constipation.  Try not to strain when you have a bowel movement.  Keep all follow-up appointments. If you  have had an infection before:  Increase the fiber in your diet as directed by your health care provider or dietitian.  Take a dietary fiber supplement if your health care provider approves.  Only take medicines as directed by your health care provider. SEEK MEDICAL CARE IF:   You have abdominal pain.  You have bloating.  You have cramps.  You have not gone to the bathroom in 3 days. SEEK IMMEDIATE MEDICAL CARE IF:   Your pain gets worse.  Yourbloating becomes very bad.  You have a fever or chills, and your symptoms suddenly get worse.  You begin vomiting.  You have bowel movements that are bloody or black. MAKE SURE YOU:  Understand these instructions.  Will watch your condition.  Will get help right away if you are not doing well or get worse. Document Released: 05/30/2004 Document Revised: 09/07/2013 Document Reviewed: 07/28/2013 [facility] Patient Information 2015 [place], [place]. This information is not intended to replace advice given to you by your health care provider. Make sure you discuss any questions you have with your health care provider.

## 2015-05-19 NOTE — Assessment & Plan Note (Signed)
No int/ext hemorrhoids. Anticipate diverticular bleed without diverticulosis. Colonoscopy UTD. Check CBC today. Discussed clear liquid diet for 1 day then bland diet over weekend, update [place] on Tues. If persistent bleed, will refer to GI. Pt/wife agree with plan.

## 2016-02-14 ENCOUNTER — Encounter: Payer: BC Managed Care – PPO | Admitting: Family Medicine

## 2016-04-01 ENCOUNTER — Other Ambulatory Visit: Payer: BC Managed Care – PPO

## 2016-04-02 ENCOUNTER — Other Ambulatory Visit: Payer: Self-pay | Admitting: Family Medicine

## 2016-04-02 DIAGNOSIS — Z1159 Encounter for screening for other viral diseases: Secondary | ICD-10-CM

## 2016-04-03 ENCOUNTER — Other Ambulatory Visit (INDEPENDENT_AMBULATORY_CARE_PROVIDER_SITE_OTHER): Payer: BC Managed Care – PPO

## 2016-04-03 DIAGNOSIS — Z1159 Encounter for screening for other viral diseases: Secondary | ICD-10-CM

## 2016-04-03 DIAGNOSIS — Z125 Encounter for screening for malignant neoplasm of prostate: Secondary | ICD-10-CM

## 2016-04-03 DIAGNOSIS — E7800 Pure hypercholesterolemia, unspecified: Secondary | ICD-10-CM | POA: Diagnosis not present

## 2016-04-04 LAB — PSA: PSA: 1.29 ng/mL (ref 0.10–4.00)

## 2016-04-04 LAB — BASIC METABOLIC PANEL
BUN: 13 mg/dL (ref 6–23)
CO2: 26 meq/L (ref 19–32)
CREATININE: 0.89 mg/dL (ref 0.40–1.50)
Calcium: 9.8 mg/dL (ref 8.4–10.5)
Chloride: 102 mEq/L (ref 96–112)
GFR: 92.52 mL/min (ref >60.00)
Glucose, Bld: 81 mg/dL (ref 70–99)
Potassium: 4.4 mEq/L (ref 3.5–5.1)
SODIUM: 139 meq/L (ref 135–145)

## 2016-04-04 LAB — LIPID PANEL
CHOLESTEROL: 221 mg/dL — AB (ref 0–200)
HDL: 54.9 mg/dL (ref >39.00)
LDL CALC: 140 mg/dL — AB (ref 0–99)
NonHDL: 166.4
TRIGLYCERIDES: 133 mg/dL (ref 0.0–149.0)
Total CHOL/HDL Ratio: 4
VLDL: 26.6 mg/dL (ref 0.0–40.0)

## 2016-04-04 LAB — HEPATITIS C ANTIBODY: HCV Ab: NEGATIVE

## 2016-04-05 ENCOUNTER — Encounter: Payer: Self-pay | Admitting: *Deleted

## 2016-04-05 ENCOUNTER — Ambulatory Visit (INDEPENDENT_AMBULATORY_CARE_PROVIDER_SITE_OTHER): Payer: BC Managed Care – PPO | Admitting: Family Medicine

## 2016-04-05 ENCOUNTER — Encounter: Payer: Self-pay | Admitting: Family Medicine

## 2016-04-05 VITALS — BP 108/72 | HR 84 | Temp 98.0°F | Ht 68.0 in | Wt 193.5 lb

## 2016-04-05 DIAGNOSIS — E663 Overweight: Secondary | ICD-10-CM | POA: Insufficient documentation

## 2016-04-05 DIAGNOSIS — R7309 Other abnormal glucose: Secondary | ICD-10-CM

## 2016-04-05 DIAGNOSIS — Z0000 Encounter for general adult medical examination without abnormal findings: Secondary | ICD-10-CM | POA: Diagnosis not present

## 2016-04-05 DIAGNOSIS — E66811 Obesity, class 1: Secondary | ICD-10-CM | POA: Insufficient documentation

## 2016-04-05 DIAGNOSIS — E7800 Pure hypercholesterolemia, unspecified: Secondary | ICD-10-CM

## 2016-04-05 DIAGNOSIS — F1090 Alcohol use, unspecified, uncomplicated: Secondary | ICD-10-CM

## 2016-04-05 DIAGNOSIS — Z7289 Other problems related to lifestyle: Secondary | ICD-10-CM

## 2016-04-05 DIAGNOSIS — Z789 Other specified health status: Secondary | ICD-10-CM

## 2016-04-05 DIAGNOSIS — E669 Obesity, unspecified: Secondary | ICD-10-CM | POA: Insufficient documentation

## 2016-04-05 HISTORY — DX: Other specified health status: Z78.9

## 2016-04-05 HISTORY — DX: Alcohol use, unspecified, uncomplicated: F10.90

## 2016-04-05 HISTORY — DX: Other problems related to lifestyle: Z72.89

## 2016-04-05 LAB — POC URINALSYSI DIPSTICK (AUTOMATED)
BILIRUBIN UA: NEGATIVE
Blood, UA: NEGATIVE
Glucose, UA: NEGATIVE
Leukocytes, UA: NEGATIVE
NITRITE UA: NEGATIVE
Protein, UA: NEGATIVE
Spec Grav, UA: 1.015
UROBILINOGEN UA: 0.2
pH, UA: 8

## 2016-04-05 NOTE — Addendum Note (Signed)
Addended by: Royann Shivers A on: 04/05/2016 11:21 AM   Modules accepted: Orders, SmartSet

## 2016-04-05 NOTE — Assessment & Plan Note (Signed)
>  5 drinks a few times a week. Mainly beer. Encouraged decreased alcohol consumption.  Discussed vitamin supplementation.

## 2016-04-05 NOTE — Assessment & Plan Note (Signed)
Pt has transitioned to plant based diet over last 5-6 weeks.  Reviewed recent weight loss noted with patient. Recommended start MVI or B12, folate, thiamine.

## 2016-04-05 NOTE — Assessment & Plan Note (Signed)
Goal LDL <100 given fmhx.  Reviewed #s with patient as well as healthy diet changes to date. ASCVD 28yr risk = 6.6%.

## 2016-04-05 NOTE — Patient Instructions (Addendum)
Congratulations on healthy diet changes to date and weight loss. Urinalysis today. Work on less alcohol.  Return as needed or in 1 year for next physical.  Health Maintenance, Male A healthy lifestyle and preventative care can promote health and wellness.  Maintain regular health, dental, and eye exams.  Eat a healthy diet. Foods like vegetables, fruits, whole grains, low-fat dairy products, and lean protein foods contain the nutrients you need and are low in calories. Decrease your intake of foods high in solid fats, added sugars, and salt. Get information about a proper diet from your health care provider, if necessary.  Regular physical exercise is one of the most important things you can do for your health. Most adults should get at least 150 minutes of moderate-intensity exercise (any activity that increases your heart rate and causes you to sweat) each week. In addition, most adults need muscle-strengthening exercises on 2 or more days a week.   Maintain a healthy weight. The body mass index (BMI) is a screening tool to identify possible weight problems. It provides an estimate of body fat based on height and weight. Your health care provider can find your BMI and can help you achieve or maintain a healthy weight. For males 20 years and older:  A BMI below 18.5 is considered underweight.  A BMI of 18.5 to 24.9 is normal.  A BMI of 25 to 29.9 is considered overweight.  A BMI of 30 and above is considered obese.  Maintain normal blood lipids and cholesterol by exercising and minimizing your intake of saturated fat. Eat a balanced diet with plenty of fruits and vegetables. Blood tests for lipids and cholesterol should begin at age 19 and be repeated every 5 years. If your lipid or cholesterol levels are high, you are over age 39, or you are at high risk for heart disease, you may need your cholesterol levels checked more frequently.Ongoing high lipid and cholesterol levels should be  treated with medicines if diet and exercise are not working.  If you smoke, find out from your health care provider how to quit. If you do not use tobacco, do not start.  Lung cancer screening is recommended for adults aged 54-80 years who are at high risk for developing lung cancer because of a history of smoking. A yearly low-dose CT scan of the lungs is recommended for people who have at least a 30-pack-year history of smoking and are current smokers or have quit within the past 15 years. A pack year of smoking is smoking an average of 1 pack of cigarettes a day for 1 year (for example, a 30-pack-year history of smoking could mean smoking 1 pack a day for 30 years or 2 packs a day for 15 years). Yearly screening should continue until the smoker has stopped smoking for at least 15 years. Yearly screening should be stopped for people who develop a health problem that would prevent them from having lung cancer treatment.  If you choose to drink alcohol, do not have more than 2 drinks per day. One drink is considered to be 12 oz (360 mL) of beer, 5 oz (150 mL) of wine, or 1.5 oz (45 mL) of liquor.  Avoid the use of street drugs. Do not share needles with anyone. Ask for help if you need support or instructions about stopping the use of drugs.  High blood pressure causes heart disease and increases the risk of stroke. High blood pressure is more likely to develop in:  People  who have blood pressure in the end of the normal range (100-139/85-89 mm Hg).  People who are overweight or obese.  People who are African American.  If you are 80-58 years of age, have your blood pressure checked every 3-5 years. If you are 84 years of age or older, have your blood pressure checked every year. You should have your blood pressure measured twice--once when you are at a hospital or clinic, and once when you are not at a hospital or clinic. Record the average of the two measurements. To check your blood pressure  when you are not at a hospital or clinic, you can use:  An automated blood pressure machine at a pharmacy.  A home blood pressure monitor.  If you are 51-68 years old, ask your health care provider if you should take aspirin to prevent heart disease.  Diabetes screening involves taking a blood sample to check your fasting blood sugar level. This should be done once every 3 years after age 49 if you are at a normal weight and without risk factors for diabetes. Testing should be considered at a younger age or be carried out more frequently if you are overweight and have at least 1 risk factor for diabetes.  Colorectal cancer can be detected and often prevented. Most routine colorectal cancer screening begins at the age of 33 and continues through age 63. However, your health care provider may recommend screening at an earlier age if you have risk factors for colon cancer. On a yearly basis, your health care provider may provide home test kits to check for hidden blood in the stool. A small camera at the end of a tube may be used to directly examine the colon (sigmoidoscopy or colonoscopy) to detect the earliest forms of colorectal cancer. Talk to your health care provider about this at age 65 when routine screening begins. A direct exam of the colon should be repeated every 5-10 years through age 51, unless early forms of precancerous polyps or small growths are found.  People who are at an increased risk for hepatitis B should be screened for this virus. You are considered at high risk for hepatitis B if:  You were born in a country where hepatitis B occurs often. Talk with your health care provider about which countries are considered high risk.  Your parents were born in a high-risk country and you have not received a shot to protect against hepatitis B (hepatitis B vaccine).  You have HIV or AIDS.  You use needles to inject street drugs.  You live with, or have sex with, someone who has  hepatitis B.  You are a man who has sex with other men (MSM).  You get hemodialysis treatment.  You take certain medicines for conditions like cancer, organ transplantation, and autoimmune conditions.  Hepatitis C blood testing is recommended for all people born from [birth year] through 1965 and any individual with known risk factors for hepatitis C.  Healthy men should no longer receive prostate-specific antigen (PSA) blood tests as part of routine cancer screening. Talk to your health care provider about prostate cancer screening.  Testicular cancer screening is not recommended for adolescents or adult males who have no symptoms. Screening includes self-exam, a health care provider exam, and other screening tests. Consult with your health care provider about any symptoms you have or any concerns you have about testicular cancer.  Practice safe sex. Use condoms and avoid high-risk sexual practices to reduce the spread of sexually  transmitted infections (STIs).  You should be screened for STIs, including gonorrhea and chlamydia if:  You are sexually active and are younger than 24 years.  You are older than 24 years, and your health care provider tells you that you are at risk for this type of infection.  Your sexual activity has changed since you were last screened, and you are at an increased risk for chlamydia or gonorrhea. Ask your health care provider if you are at risk.  If you are at risk of being infected with HIV, it is recommended that you take a prescription medicine daily to prevent HIV infection. This is called pre-exposure prophylaxis (PrEP). You are considered at risk if:  You are a man who has sex with other men (MSM).  You are a heterosexual man who is sexually active with multiple partners.  You take drugs by injection.  You are sexually active with a partner who has HIV.  Talk with your health care provider about whether you are at high risk of being infected with HIV. If  you choose to begin PrEP, you should first be tested for HIV. You should then be tested every 3 months for as long as you are taking PrEP.  Use sunscreen. Apply sunscreen liberally and repeatedly throughout the day. You should seek shade when your shadow is shorter than you. Protect yourself by wearing long sleeves, pants, a wide-brimmed hat, and sunglasses year round whenever you are outdoors.  Tell your health care provider of new moles or changes in moles, especially if there is a change in shape or color. Also, tell your health care provider if a mole is larger than the size of a pencil eraser.  A one-time screening for abdominal aortic aneurysm (AAA) and surgical repair of large AAAs by ultrasound is recommended for men aged 28-75 years who are current or former smokers.  Stay current with your vaccines (immunizations).   This information is not intended to replace advice given to you by your health care provider. Make sure you discuss any questions you have with your health care provider.   Document Released: 02/29/2008 Document Revised: 09/23/2014 Document Reviewed: 01/28/2011 Elsevier Interactive Patient Education [employer].

## 2016-04-05 NOTE — Assessment & Plan Note (Signed)
This has resolved with weight loss 

## 2016-04-05 NOTE — Progress Notes (Signed)
Pre visit review using our clinic review tool, if applicable. No additional management support is needed unless otherwise documented below in the visit note. 

## 2016-04-05 NOTE — Progress Notes (Signed)
BP 108/72 mmHg  Pulse 84  Temp(Src) 98 F (36.7 C) (Oral)  Ht 5\' 8"  (1.727 m)  Wt 193 lb 8 oz (87.771 kg)  BMI 29.43 kg/m2   CC: CPE  Subjective:    Patient ID: Adam Daniels, male    DOB: October 13, 1955, 60 y.o.   MRN: [id number]  HPI: Adam Daniels is a 60 y.o. male presenting on 04/05/2016 for Annual Exam   Went to plant based diet 1.5 months ago. Lost 18 lbs since 05/2015.   H/o mod diverticulosis with diverticulitis in the past. Presumed diverticular bleed last year - resolved.   Strong fmhx CAD. Goal LDL <100. Denies chest pain/tightness, claudication sxs, denies ED issues.  Preventative: COLONOSCOPY Date: 11/2011 tubular adenoma, mod diverticulosis, fmhx colon CA [name]) rpt 68yrs Prostate cancer screening - normal prostate exams in past along with normal PSA. No fmhx prostate cancer. No nocturia, has strong stream. Denies ED trouble. Discussed screening, agrees to DRE today.  Flu - does receive at work  Td - 2007  Seat belt use discussed. Sunscreen use discussed. Denies changing moles.   Caffeine: 1 cup coffee/day  Lives with wife, 2 dogs  Occupation: heating and air  Activity: slacked off going to [employer], does walk in woods  Diet: fruits/vegetables daily, good water, plant based diet (2017)  Relevant past medical, surgical, family and social history reviewed and updated as indicated. Interim medical history since our last visit reviewed. Allergies and medications reviewed and updated. No current outpatient prescriptions on file prior to visit.   No current facility-administered medications on file prior to visit.    Review of Systems  Constitutional: Negative for fever, chills, activity change, appetite change, fatigue and unexpected weight change.       Energy great   HENT: Negative for hearing loss.   Eyes: Positive for visual disturbance (presbyopia).  Respiratory: Negative for cough, chest tightness, shortness of breath and wheezing.   Cardiovascular:  Negative for chest pain, palpitations and leg swelling.  Gastrointestinal: Negative for nausea, vomiting, abdominal pain, diarrhea, constipation, blood in stool and abdominal distention.  Genitourinary: Negative for hematuria and difficulty urinating.  Musculoskeletal: Negative for myalgias, arthralgias and neck pain.  Skin: Negative for rash.  Neurological: Negative for dizziness, seizures, syncope and headaches.  Hematological: Negative for adenopathy. Does not bruise/bleed easily.  Psychiatric/Behavioral: Negative for dysphoric mood. The patient is not nervous/anxious.    Per HPI unless specifically indicated in ROS section     Objective:    BP 108/72 mmHg  Pulse 84  Temp(Src) 98 F (36.7 C) (Oral)  Ht 5\' 8"  (1.727 m)  Wt 193 lb 8 oz (87.771 kg)  BMI 29.43 kg/m2  Wt Readings from Last 3 Encounters:  04/05/16 193 lb 8 oz (87.771 kg)  05/19/15 211 lb (95.709 kg)  03/17/14 200 lb (90.719 kg)    Physical Exam  Constitutional: He is oriented to person, place, and time. He appears well-developed and well-nourished. No distress.  HENT:  Head: Normocephalic and atraumatic.  Right Ear: Hearing, tympanic membrane, external ear and ear canal normal.  Left Ear: Hearing, tympanic membrane, external ear and ear canal normal.  Nose: Nose normal.  Mouth/Throat: Uvula is midline, oropharynx is clear and moist and mucous membranes are normal. No oropharyngeal exudate, posterior oropharyngeal edema or posterior oropharyngeal erythema.  Eyes: Conjunctivae and EOM are normal. Pupils are equal, round, and reactive to light. No scleral icterus.  Neck: Normal range of motion. Neck supple. No thyromegaly present.  Cardiovascular: Normal rate, regular rhythm, normal heart sounds and intact distal pulses.   No murmur heard. Pulses:      Radial pulses are 2+ on the right side, and 2+ on the left side.  Pulmonary/Chest: Effort normal and breath sounds normal. No respiratory distress. He has no wheezes.  He has no rales.  Abdominal: Soft. Bowel sounds are normal. He exhibits no distension and no mass. There is no tenderness. There is no rebound and no guarding.  Genitourinary: Rectum normal and prostate normal. Rectal exam shows no external hemorrhoid, no internal hemorrhoid, no fissure, no mass, no tenderness and anal tone normal. Prostate is not enlarged (15gm) and not tender.  Musculoskeletal: Normal range of motion. He exhibits no edema.  Lymphadenopathy:    He has no cervical adenopathy.  Neurological: He is alert and oriented to person, place, and time.  CN grossly intact, station and gait intact  Skin: Skin is warm and dry. No rash noted.  Psychiatric: He has a normal mood and affect. His behavior is normal. Judgment and thought content normal.  Nursing note and vitals reviewed.  Results for orders placed or performed in visit on 04/03/16  Lipid panel  Result Value Ref Range   Cholesterol 221 (H) 0 - 200 mg/dL   Triglycerides 133.0 0.0 - 149.0 mg/dL   HDL 54.90 >39.00 mg/dL   VLDL 26.6 0.0 - 40.0 mg/dL   LDL Cholesterol 140 (H) 0 - 99 mg/dL   Total CHOL/HDL Ratio 4    NonHDL 166.40   PSA  Result Value Ref Range   PSA 1.29 0.10 - 4.00 ng/mL  Basic metabolic panel  Result Value Ref Range   Sodium 139 135 - 145 mEq/L   Potassium 4.4 3.5 - 5.1 mEq/L   Chloride 102 96 - 112 mEq/L   CO2 26 19 - 32 mEq/L   Glucose, Bld 81 70 - 99 mg/dL   BUN 13 6 - 23 mg/dL   Creatinine, Ser 0.89 0.40 - 1.50 mg/dL   Calcium 9.8 8.4 - 10.5 mg/dL   GFR 92.52 >60.00 mL/min  Hepatitis C antibody  Result Value Ref Range   HCV Ab NEGATIVE NEGATIVE   Lab Results  Component Value Date   WBC 7.1 05/19/2015   HGB 15.7 05/19/2015   HCT 46.8 05/19/2015   MCV 92.5 05/19/2015   PLT 270.0 05/19/2015       Assessment & Plan:   Problem List Items Addressed This Visit    Alcohol use ([place])    >5 drinks a few times a week. Mainly beer. Encouraged decreased alcohol consumption.  Discussed vitamin  supplementation.       Healthcare maintenance - Primary    Preventative protocols reviewed and updated unless pt declined. Discussed healthy diet and lifestyle.  Check UA given fmhx bladder cancer.       HYPERCHOLESTEROLEMIA    Goal LDL <100 given fmhx.  Reviewed #s with patient as well as healthy diet changes to date. ASCVD 13yr risk = 6.6%.       RESOLVED: HYPERGLYCEMIA    This has resolved with weight loss.       Overweight    Pt has transitioned to plant based diet over last 5-6 weeks.  Reviewed recent weight loss noted with patient. Recommended start MVI or B12, folate, thiamine.           Follow up plan: Return in about 1 year (around 04/05/2017), or as needed, for annual exam, prior fasting for blood work.  [name], MD

## 2016-04-05 NOTE — Assessment & Plan Note (Signed)
Preventative protocols reviewed and updated unless pt declined. Discussed healthy diet and lifestyle.  Check UA given fmhx bladder cancer.

## 2016-04-05 NOTE — Addendum Note (Signed)
Addended by: Ellamae Sia on: 04/05/2016 05:23 PM   Modules accepted: Miquel Dunn

## 2016-10-31 ENCOUNTER — Encounter: Payer: Self-pay | Admitting: Internal Medicine

## 2017-01-01 ENCOUNTER — Encounter: Payer: Self-pay | Admitting: Internal Medicine

## 2017-02-14 HISTORY — PX: COLONOSCOPY: SHX174

## 2017-02-26 ENCOUNTER — Ambulatory Visit (AMBULATORY_SURGERY_CENTER): Payer: Self-pay | Admitting: *Deleted

## 2017-02-26 ENCOUNTER — Encounter: Payer: Self-pay | Admitting: Internal Medicine

## 2017-02-26 VITALS — Ht 67.0 in | Wt 189.0 lb

## 2017-02-26 DIAGNOSIS — Z86010 Personal history of colonic polyps: Secondary | ICD-10-CM

## 2017-02-26 NOTE — Progress Notes (Signed)
Patient denies any allergies to eggs or soy. Patient denies any problems with anesthesia/sedation. Patient denies any oxygen use at home and does not take any diet/weight loss medications. EMMI education assisgned to patient on colonoscopy, this was explained and instructions given to patient. 

## 2017-03-12 ENCOUNTER — Ambulatory Visit (AMBULATORY_SURGERY_CENTER): Payer: BC Managed Care – PPO | Admitting: Internal Medicine

## 2017-03-12 ENCOUNTER — Encounter: Payer: Self-pay | Admitting: Internal Medicine

## 2017-03-12 VITALS — BP 128/80 | HR 57 | Temp 97.8°F | Resp 8 | Ht 68.0 in | Wt 193.0 lb

## 2017-03-12 DIAGNOSIS — Z86010 Personal history of colonic polyps: Secondary | ICD-10-CM | POA: Diagnosis not present

## 2017-03-12 MED ORDER — SODIUM CHLORIDE 0.9 % IV SOLN: 500.0000 mL | INTRAVENOUS | Status: DC

## 2017-03-12 NOTE — Progress Notes (Signed)
Alert and oriented x3, pleased with MAC, report to RN  

## 2017-03-12 NOTE — Patient Instructions (Addendum)
   No polyps today.  Next routine colonoscopy or other screening test in 10 years - 2028  I appreciate the opportunity to care for you. Gatha Mayer, MD, FACG  YOU HAD AN ENDOSCOPIC PROCEDURE TODAY: Refer to the procedure report and other information in the discharge instructions given to you for any specific questions about what was found during the examination. If this information does not answer your questions, please call [place] office at [phone] to clarify.   YOU SHOULD EXPECT: Some feelings of bloating in the abdomen. Passage of more gas than usual. Walking can help get rid of the air that was put into your GI tract during the procedure and reduce the bloating. If you had a lower endoscopy (such as a colonoscopy or flexible sigmoidoscopy) you may notice spotting of blood in your stool or on the toilet paper. Some abdominal soreness may be present for a day or two, also.  DIET: Your first meal following the procedure should be a light meal and then it is ok to progress to your normal diet. A half-sandwich or bowl of soup is an example of a good first meal. Heavy or fried foods are harder to digest and may make you feel nauseous or bloated. Drink plenty of fluids but you should avoid alcoholic beverages for 24 hours. If you had a esophageal dilation, please see attached instructions for diet.    ACTIVITY: Your care partner should take you home directly after the procedure. You should plan to take it easy, moving slowly for the rest of the day. You can resume normal activity the day after the procedure however YOU SHOULD NOT DRIVE, use power tools, machinery or perform tasks that involve climbing or major physical exertion for 24 hours (because of the sedation medicines used during the test).   SYMPTOMS TO REPORT IMMEDIATELY: A gastroenterologist can be reached at any hour. Please call [phone]  for any of the following symptoms:  Following lower endoscopy (colonoscopy,  flexible sigmoidoscopy) Excessive amounts of blood in the stool  Significant tenderness, worsening of abdominal pains  Swelling of the abdomen that is new, acute  Fever of 100 or higher    FOLLOW UP:  If any biopsies were taken you will be contacted by phone or by letter within the next 1-3 weeks. Call [phone]  if you have not heard about the biopsies in 3 weeks.  Please also call with any specific questions about appointments or follow up tests.

## 2017-03-12 NOTE — Progress Notes (Signed)
Pt's states no medical or surgical changes since previsit or office visit. 

## 2017-03-12 NOTE — Op Note (Signed)
[facility] Patient Name: Adam Daniels Procedure Date: 03/12/2017 11:46 AM MRN: [id number] Endoscopist: Gatha Mayer , MD Age: 61 Referring MD:  Date of Birth: 29-Jul-1956 Gender: Male Account #: [id number] Procedure:                Colonoscopy Indications:              High risk colon cancer surveillance: Personal                            history of colonic polyps Medicines:                Propofol per Anesthesia, Monitored Anesthesia Care Procedure:                Pre-Anesthesia Assessment:                           - Prior to the procedure, a History and Physical                            was performed, and patient medications and                            allergies were reviewed. The patient's tolerance of                            previous anesthesia was also reviewed. The risks                            and benefits of the procedure and the sedation                            options and risks were discussed with the patient.                            All questions were answered, and informed consent                            was obtained. Prior Anticoagulants: The patient has                            taken no previous anticoagulant or antiplatelet                            agents. ASA Grade Assessment: II - A patient with                            mild systemic disease. After reviewing the risks                            and benefits, the patient was deemed in                            satisfactory condition to undergo the procedure.  After obtaining informed consent, the colonoscope                            was passed under direct vision. Throughout the                            procedure, the patient's blood pressure, pulse, and                            oxygen saturations were monitored continuously. The                            Colonoscope was introduced through the anus and                            advanced to the  the cecum, identified by                            appendiceal orifice and ileocecal valve. The                            ileocecal valve, appendiceal orifice, and rectum                            were photographed. The quality of the bowel                            preparation was good. The bowel preparation used                            was Miralax. Scope In: 12:02:37 PM Scope Out: 12:13:29 PM Scope Withdrawal Time: 0 hours 8 minutes 38 seconds  Total Procedure Duration: 0 hours 10 minutes 52 seconds  Findings:                 The perianal and digital rectal examinations were                            normal. Pertinent negatives include normal prostate                            (size, shape, and consistency).                           Multiple small and large-mouthed diverticula were                            found in the left colon (severe) and right colon                            (scattered). There was narrowing of the sigmoid                            colon in association with the diverticular opening.  The exam was otherwise without abnormality on                            direct and retroflexion views. Complications:            No immediate complications. Estimated blood loss:                            None. Estimated Blood Loss:     Estimated blood loss: none. Impression:               - Severe diverticulosis in the left colon and in                            the right colon. There was narrowing of the colon                            in association with the diverticular opening.                           - The examination was otherwise normal on direct                            and retroflexion views.                           - No specimens collected.                           - Personal history of colonic polyps. One                            diminutive adenoma in 2013. Now also know that                            father had prostate  and bladder cancer NOT colon                            cancer as thought in past. Recommendation:           - Repeat colonoscopy in 10 years.                           - Resume previous diet.                           - Continue present medications. Gatha Mayer, MD 03/12/2017 12:20:23 PM This report has been signed electronically.

## 2017-03-13 ENCOUNTER — Telehealth: Payer: Self-pay | Admitting: *Deleted

## 2017-03-13 ENCOUNTER — Encounter: Payer: Self-pay | Admitting: Family Medicine

## 2017-03-13 NOTE — Telephone Encounter (Signed)
NO answer left message and will attempt to call  Back later for followup. Sm

## 2017-03-13 NOTE — Telephone Encounter (Signed)
Left message no answer for follow up call. SM

## 2017-03-22 ENCOUNTER — Other Ambulatory Visit: Payer: Self-pay | Admitting: Family Medicine

## 2017-03-22 DIAGNOSIS — E7800 Pure hypercholesterolemia, unspecified: Secondary | ICD-10-CM

## 2017-03-22 DIAGNOSIS — Z789 Other specified health status: Secondary | ICD-10-CM

## 2017-03-22 DIAGNOSIS — Z7289 Other problems related to lifestyle: Secondary | ICD-10-CM

## 2017-03-22 DIAGNOSIS — Z125 Encounter for screening for malignant neoplasm of prostate: Secondary | ICD-10-CM

## 2017-03-24 ENCOUNTER — Other Ambulatory Visit: Payer: BC Managed Care – PPO

## 2017-03-24 ENCOUNTER — Other Ambulatory Visit (INDEPENDENT_AMBULATORY_CARE_PROVIDER_SITE_OTHER): Payer: BC Managed Care – PPO

## 2017-03-24 DIAGNOSIS — Z125 Encounter for screening for malignant neoplasm of prostate: Secondary | ICD-10-CM

## 2017-03-24 DIAGNOSIS — Z7289 Other problems related to lifestyle: Secondary | ICD-10-CM

## 2017-03-24 DIAGNOSIS — Z789 Other specified health status: Secondary | ICD-10-CM

## 2017-03-24 DIAGNOSIS — E7800 Pure hypercholesterolemia, unspecified: Secondary | ICD-10-CM | POA: Diagnosis not present

## 2017-03-24 LAB — COMPREHENSIVE METABOLIC PANEL
ALBUMIN: 4 g/dL (ref 3.5–5.2)
ALK PHOS: 73 U/L (ref 39–117)
ALT: 21 U/L (ref 0–53)
AST: 15 U/L (ref 0–37)
BILIRUBIN TOTAL: 0.4 mg/dL (ref 0.2–1.2)
BUN: 14 mg/dL (ref 6–23)
CO2: 24 mEq/L (ref 19–32)
Calcium: 9.1 mg/dL (ref 8.4–10.5)
Chloride: 104 mEq/L (ref 96–112)
Creatinine, Ser: 0.77 mg/dL (ref 0.40–1.50)
GFR: 109 mL/min (ref >60.00)
GLUCOSE: 96 mg/dL (ref 70–99)
POTASSIUM: 4.1 meq/L (ref 3.5–5.1)
Sodium: 137 mEq/L (ref 135–145)
TOTAL PROTEIN: 6.8 g/dL (ref 6.0–8.3)

## 2017-03-24 LAB — LIPID PANEL
CHOLESTEROL: 217 mg/dL — AB (ref 0–200)
HDL: 64.6 mg/dL (ref >39.00)
LDL Cholesterol: 136 mg/dL — ABNORMAL HIGH (ref 0–99)
NonHDL: 152.02
TRIGLYCERIDES: 81 mg/dL (ref 0.0–149.0)
Total CHOL/HDL Ratio: 3
VLDL: 16.2 mg/dL (ref 0.0–40.0)

## 2017-03-24 LAB — VITAMIN B12: Vitamin B-12: 320 pg/mL (ref 211–911)

## 2017-03-24 LAB — PSA: PSA: 1.86 ng/mL (ref 0.10–4.00)

## 2017-03-24 LAB — FOLATE: FOLATE: 8.3 ng/mL (ref >5.9)

## 2017-03-28 ENCOUNTER — Ambulatory Visit (INDEPENDENT_AMBULATORY_CARE_PROVIDER_SITE_OTHER): Payer: BC Managed Care – PPO | Admitting: Family Medicine

## 2017-03-28 ENCOUNTER — Encounter: Payer: BC Managed Care – PPO | Admitting: Family Medicine

## 2017-03-28 ENCOUNTER — Encounter: Payer: Self-pay | Admitting: Family Medicine

## 2017-03-28 VITALS — BP 108/60 | HR 57 | Ht 68.0 in | Wt 189.0 lb

## 2017-03-28 DIAGNOSIS — Z789 Other specified health status: Secondary | ICD-10-CM

## 2017-03-28 DIAGNOSIS — E7800 Pure hypercholesterolemia, unspecified: Secondary | ICD-10-CM

## 2017-03-28 DIAGNOSIS — K5730 Diverticulosis of large intestine without perforation or abscess without bleeding: Secondary | ICD-10-CM

## 2017-03-28 DIAGNOSIS — Z7289 Other problems related to lifestyle: Secondary | ICD-10-CM

## 2017-03-28 DIAGNOSIS — Z0000 Encounter for general adult medical examination without abnormal findings: Secondary | ICD-10-CM | POA: Diagnosis not present

## 2017-03-28 NOTE — Progress Notes (Signed)
Pre visit review using our clinic review tool, if applicable. No additional management support is needed unless otherwise documented below in the visit note. 

## 2017-03-28 NOTE — Patient Instructions (Addendum)
You are doing well Return as needed or in 1 year for next physical  Health Maintenance, Male A healthy lifestyle and preventive care is important for your health and wellness. Ask your health care provider about what schedule of regular examinations is right for you. What should I know about weight and diet? Eat a Healthy Diet  Eat plenty of vegetables, fruits, whole grains, low-fat dairy products, and lean protein.  Do not eat a lot of foods high in solid fats, added sugars, or salt.  Maintain a Healthy Weight Regular exercise can help you achieve or maintain a healthy weight. You should:  Do at least 150 minutes of exercise each week. The exercise should increase your heart rate and make you sweat (moderate-intensity exercise).  Do strength-training exercises at least twice a week.  Watch Your Levels of Cholesterol and Blood Lipids  Have your blood tested for lipids and cholesterol every 5 years starting at 61 years of age. If you are at high risk for heart disease, you should start having your blood tested when you are 61 years old. You may need to have your cholesterol levels checked more often if: ? Your lipid or cholesterol levels are high. ? You are older than 61 years of age. ? You are at high risk for heart disease.  What should I know about cancer screening? Many types of cancers can be detected early and may often be prevented. Lung Cancer  You should be screened every year for lung cancer if: ? You are a current smoker who has smoked for at least 30 years. ? You are a former smoker who has quit within the past 15 years.  Talk to your health care provider about your screening options, when you should start screening, and how often you should be screened.  Colorectal Cancer  Routine colorectal cancer screening usually begins at 61 years of age and should be repeated every 5-10 years until you are 61 years old. You may need to be screened more often if early forms of  precancerous polyps or small growths are found. Your health care provider may recommend screening at an earlier age if you have risk factors for colon cancer.  Your health care provider may recommend using home test kits to check for hidden blood in the stool.  A small camera at the end of a tube can be used to examine your colon (sigmoidoscopy or colonoscopy). This checks for the earliest forms of colorectal cancer.  Prostate and Testicular Cancer  Depending on your age and overall health, your health care provider may do certain tests to screen for prostate and testicular cancer.  Talk to your health care provider about any symptoms or concerns you have about testicular or prostate cancer.  Skin Cancer  Check your skin from head to toe regularly.  Tell your health care provider about any new moles or changes in moles, especially if: ? There is a change in a mole's size, shape, or color. ? You have a mole that is larger than a pencil eraser.  Always use sunscreen. Apply sunscreen liberally and repeat throughout the day.  Protect yourself by wearing long sleeves, pants, a wide-brimmed hat, and sunglasses when outside.  What should I know about heart disease, diabetes, and high blood pressure?  If you are 2-39 years of age, have your blood pressure checked every 3-5 years. If you are 9 years of age or older, have your blood pressure checked every year. You should have your  blood pressure measured twice-once when you are at a hospital or clinic, and once when you are not at a hospital or clinic. Record the average of the two measurements. To check your blood pressure when you are not at a hospital or clinic, you can use: ? An automated blood pressure machine at a pharmacy. ? A home blood pressure monitor.  Talk to your health care provider about your target blood pressure.  If you are between 38-31 years old, ask your health care provider if you should take aspirin to prevent heart  disease.  Have regular diabetes screenings by checking your fasting blood sugar level. ? If you are at a normal weight and have a low risk for diabetes, have this test once every three years after the age of 45. ? If you are overweight and have a high risk for diabetes, consider being tested at a younger age or more often.  A one-time screening for abdominal aortic aneurysm (AAA) by ultrasound is recommended for men aged 47-75 years who are current or former smokers. What should I know about preventing infection? Hepatitis B If you have a higher risk for hepatitis B, you should be screened for this virus. Talk with your health care provider to find out if you are at risk for hepatitis B infection. Hepatitis C Blood testing is recommended for:  Everyone born from [birth year] through 1965.  Anyone with known risk factors for hepatitis C.  Sexually Transmitted Diseases (STDs)  You should be screened each year for STDs including gonorrhea and chlamydia if: ? You are sexually active and are younger than 61 years of age. ? You are older than 61 years of age and your health care provider tells you that you are at risk for this type of infection. ? Your sexual activity has changed since you were last screened and you are at an increased risk for chlamydia or gonorrhea. Ask your health care provider if you are at risk.  Talk with your health care provider about whether you are at high risk of being infected with HIV. Your health care provider may recommend a prescription medicine to help prevent HIV infection.  What else can I do?  Schedule regular health, dental, and eye exams.  Stay current with your vaccines (immunizations).  Do not use any tobacco products, such as cigarettes, chewing tobacco, and e-cigarettes. If you need help quitting, ask your health care provider.  Limit alcohol intake to no more than 2 drinks per day. One drink equals 12 ounces of beer, 5 ounces of wine, or 1 ounces of  hard liquor.  Do not use street drugs.  Do not share needles.  Ask your health care provider for help if you need support or information about quitting drugs.  Tell your health care provider if you often feel depressed.  Tell your health care provider if you have ever been abused or do not feel safe at home. This information is not intended to replace advice given to you by your health care provider. Make sure you discuss any questions you have with your health care provider. Document Released: 02/29/2008 Document Revised: 05/01/2016 Document Reviewed: 06/06/2015 Elsevier Interactive Patient Education  [employer].

## 2017-03-28 NOTE — Assessment & Plan Note (Signed)
Preventative protocols reviewed and updated unless pt declined. Discussed healthy diet and lifestyle.  

## 2017-03-28 NOTE — Assessment & Plan Note (Addendum)
Patient managing with healthy diet and lifestyle changes. Desires to avoid medication if possible. He does have fmhx CAD.  The 10-year ASCVD risk score [name], et al., 2013) is: 6.3%   Values used to calculate the score:     Age: 61 years     Sex: Male     Is Non-Hispanic African American: No     Diabetic: No     Tobacco smoker: No     Systolic Blood Pressure: 244 mmHg     Is BP treated: No     HDL Cholesterol: 64.6 mg/dL     Total Cholesterol: 217 mg/dL

## 2017-03-28 NOTE — Assessment & Plan Note (Signed)
Significant decrease over the past year.

## 2017-03-28 NOTE — Assessment & Plan Note (Signed)
Severe by recent colonoscopy.

## 2017-03-28 NOTE — Progress Notes (Signed)
BP 108/60   Pulse (!) 57   Ht 5\' 8"  (1.727 m)   Wt 189 lb (85.7 kg)   SpO2 98%   BMI 28.74 kg/m    CC: CPE Subjective:    Patient ID: Adam Daniels, male    DOB: 08/30/1956, 61 y.o.   MRN: [id number]  HPI: Adam Daniels is a 61 y.o. male presenting on 03/28/2017 for Annual Exam   Preventative: COLONOSCOPY Date: 02/2017 [name]) severe diverticulosis, rpt 10 yrs  Prostate cancer screening - he does have fmhx prostate cancer (father) so we have recommended yearly screening. Will defer DRE this year given stability. No nocturia, has strong stream.  Flu yearly Td - 2007  Seat belt use discussed.  Sunscreen use discussed. Denies changing moles.  Ex smoker - quit remotely Alcohol - a few days a week   Caffeine: 1 cup coffee/day  Lives with wife, 2 dogs  Occupation: heating and air - retired  Activity: slacked off going to [employer], does walk in woods  Diet: fruits/vegetables daily, good water, plant based diet (2017)   Relevant past medical, surgical, family and social history reviewed and updated as indicated. Interim medical history since our last visit reviewed. Allergies and medications reviewed and updated. No outpatient prescriptions prior to visit.   Facility-Administered Medications Prior to Visit  Medication Dose Route Frequency Provider Last Rate Last Dose  . 0.9 %  sodium chloride infusion  500 mL Intravenous Continuous Gatha Mayer, MD         Per HPI unless specifically indicated in ROS section below Review of Systems  Constitutional: Negative for activity change, appetite change, chills, fatigue, fever and unexpected weight change.  HENT: Negative for hearing loss.   Eyes: Negative for visual disturbance.  Respiratory: Negative for cough, chest tightness, shortness of breath and wheezing.   Cardiovascular: Negative for chest pain, palpitations and leg swelling.  Gastrointestinal: Negative for abdominal distention, abdominal pain, blood in stool,  constipation, diarrhea, nausea and vomiting.  Genitourinary: Negative for difficulty urinating and hematuria.  Musculoskeletal: Negative for arthralgias, myalgias and neck pain.  Skin: Negative for rash.  Neurological: Negative for dizziness, seizures, syncope and headaches.  Hematological: Negative for adenopathy. Does not bruise/bleed easily.  Psychiatric/Behavioral: Negative for dysphoric mood. The patient is not nervous/anxious.        Objective:    BP 108/60   Pulse (!) 57   Ht 5\' 8"  (1.727 m)   Wt 189 lb (85.7 kg)   SpO2 98%   BMI 28.74 kg/m   Wt Readings from Last 3 Encounters:  03/28/17 189 lb (85.7 kg)  03/12/17 193 lb (87.5 kg)  02/26/17 189 lb (85.7 kg)    Physical Exam  Constitutional: He is oriented to person, place, and time. He appears well-developed and well-nourished. No distress.  HENT:  Head: Normocephalic and atraumatic.  Right Ear: Hearing, tympanic membrane, external ear and ear canal normal.  Left Ear: Hearing, tympanic membrane, external ear and ear canal normal.  Nose: Nose normal.  Mouth/Throat: Uvula is midline, oropharynx is clear and moist and mucous membranes are normal. No oropharyngeal exudate, posterior oropharyngeal edema or posterior oropharyngeal erythema.  Eyes: Pupils are equal, round, and reactive to light. Conjunctivae and EOM are normal. No scleral icterus.  Neck: Normal range of motion. Neck supple. No thyromegaly present.  Cardiovascular: Normal rate, regular rhythm, normal heart sounds and intact distal pulses.   No murmur heard. Pulses:      Radial pulses are 2+  on the right side, and 2+ on the left side.  Pulmonary/Chest: Effort normal and breath sounds normal. No respiratory distress. He has no wheezes. He has no rales.  Abdominal: Soft. Bowel sounds are normal. He exhibits no distension and no mass. There is no tenderness. There is no rebound and no guarding.  Musculoskeletal: Normal range of motion. He exhibits no edema.    Lymphadenopathy:    He has no cervical adenopathy.  Neurological: He is alert and oriented to person, place, and time.  CN grossly intact, station and gait intact  Skin: Skin is warm and dry. No rash noted.  Psychiatric: He has a normal mood and affect. His behavior is normal. Judgment and thought content normal.  Nursing note and vitals reviewed.  Results for orders placed or performed in visit on 03/24/17  Lipid panel  Result Value Ref Range   Cholesterol 217 (H) 0 - 200 mg/dL   Triglycerides 81.0 0.0 - 149.0 mg/dL   HDL 64.60 >39.00 mg/dL   VLDL 16.2 0.0 - 40.0 mg/dL   LDL Cholesterol 136 (H) 0 - 99 mg/dL   Total CHOL/HDL Ratio 3    NonHDL 152.02   Comprehensive metabolic panel  Result Value Ref Range   Sodium 137 135 - 145 mEq/L   Potassium 4.1 3.5 - 5.1 mEq/L   Chloride 104 96 - 112 mEq/L   CO2 24 19 - 32 mEq/L   Glucose, Bld 96 70 - 99 mg/dL   BUN 14 6 - 23 mg/dL   Creatinine, Ser 0.77 0.40 - 1.50 mg/dL   Total Bilirubin 0.4 0.2 - 1.2 mg/dL   Alkaline Phosphatase 73 39 - 117 U/L   AST 15 0 - 37 U/L   ALT 21 0 - 53 U/L   Total Protein 6.8 6.0 - 8.3 g/dL   Albumin 4.0 3.5 - 5.2 g/dL   Calcium 9.1 8.4 - 10.5 mg/dL   GFR 109.00 >60.00 mL/min  PSA  Result Value Ref Range   PSA 1.86 0.10 - 4.00 ng/mL  Vitamin B12  Result Value Ref Range   Vitamin B-12 320 211 - 911 pg/mL  Folate  Result Value Ref Range   Folate 8.3 >5.9 ng/mL      Assessment & Plan:   Problem List Items Addressed This Visit    Alcohol use ([place])    Significant decrease over the past year.       Diverticulosis of colon    Severe by recent colonoscopy.      Healthcare maintenance - Primary    Preventative protocols reviewed and updated unless pt declined. Discussed healthy diet and lifestyle.       HYPERCHOLESTEROLEMIA    Patient managing with healthy diet and lifestyle changes. Desires to avoid medication if possible. He does have fmhx CAD.  The 10-year ASCVD risk score [name], et  al., 2013) is: 6.3%   Values used to calculate the score:     Age: 89 years     Sex: Male     Is Non-Hispanic African American: No     Diabetic: No     Tobacco smoker: No     Systolic Blood Pressure: 323 mmHg     Is BP treated: No     HDL Cholesterol: 64.6 mg/dL     Total Cholesterol: 217 mg/dL           Follow up plan: Return in about 1 year (around 03/28/2018) for annual exam, prior fasting for blood work.  [name], MD

## 2017-04-04 ENCOUNTER — Other Ambulatory Visit: Payer: BC Managed Care – PPO

## 2017-04-07 ENCOUNTER — Encounter: Payer: BC Managed Care – PPO | Admitting: Family Medicine

## 2018-04-03 ENCOUNTER — Other Ambulatory Visit: Payer: BC Managed Care – PPO

## 2018-04-06 ENCOUNTER — Other Ambulatory Visit: Payer: Self-pay | Admitting: Family Medicine

## 2018-04-06 DIAGNOSIS — Z125 Encounter for screening for malignant neoplasm of prostate: Secondary | ICD-10-CM

## 2018-04-06 DIAGNOSIS — Z7289 Other problems related to lifestyle: Secondary | ICD-10-CM

## 2018-04-06 DIAGNOSIS — Z789 Other specified health status: Secondary | ICD-10-CM

## 2018-04-06 DIAGNOSIS — E7800 Pure hypercholesterolemia, unspecified: Secondary | ICD-10-CM

## 2018-04-07 ENCOUNTER — Encounter: Payer: BC Managed Care – PPO | Admitting: Family Medicine

## 2018-04-08 ENCOUNTER — Other Ambulatory Visit (INDEPENDENT_AMBULATORY_CARE_PROVIDER_SITE_OTHER): Payer: BC Managed Care – PPO

## 2018-04-08 DIAGNOSIS — Z125 Encounter for screening for malignant neoplasm of prostate: Secondary | ICD-10-CM | POA: Diagnosis not present

## 2018-04-08 DIAGNOSIS — E7800 Pure hypercholesterolemia, unspecified: Secondary | ICD-10-CM | POA: Diagnosis not present

## 2018-04-08 DIAGNOSIS — Z789 Other specified health status: Secondary | ICD-10-CM

## 2018-04-08 DIAGNOSIS — Z7289 Other problems related to lifestyle: Secondary | ICD-10-CM

## 2018-04-08 LAB — COMPREHENSIVE METABOLIC PANEL
ALK PHOS: 95 U/L (ref 39–117)
ALT: 24 U/L (ref 0–53)
AST: 17 U/L (ref 0–37)
Albumin: 4.3 g/dL (ref 3.5–5.2)
BUN: 12 mg/dL (ref 6–23)
CALCIUM: 9.4 mg/dL (ref 8.4–10.5)
CO2: 29 meq/L (ref 19–32)
Chloride: 101 mEq/L (ref 96–112)
Creatinine, Ser: 0.8 mg/dL (ref 0.40–1.50)
GFR: 103.94 mL/min (ref >60.00)
Glucose, Bld: 120 mg/dL — ABNORMAL HIGH (ref 70–99)
POTASSIUM: 4.5 meq/L (ref 3.5–5.1)
Sodium: 137 mEq/L (ref 135–145)
TOTAL PROTEIN: 7 g/dL (ref 6.0–8.3)
Total Bilirubin: 0.7 mg/dL (ref 0.2–1.2)

## 2018-04-08 LAB — PSA: PSA: 1.7 ng/mL (ref 0.10–4.00)

## 2018-04-08 LAB — VITAMIN B12: Vitamin B-12: 465 pg/mL (ref 211–911)

## 2018-04-08 LAB — LIPID PANEL
CHOL/HDL RATIO: 4
Cholesterol: 228 mg/dL — ABNORMAL HIGH (ref 0–200)
HDL: 58.7 mg/dL (ref >39.00)
LDL CALC: 146 mg/dL — AB (ref 0–99)
NONHDL: 169.78
TRIGLYCERIDES: 120 mg/dL (ref 0.0–149.0)
VLDL: 24 mg/dL (ref 0.0–40.0)

## 2018-04-14 ENCOUNTER — Ambulatory Visit (INDEPENDENT_AMBULATORY_CARE_PROVIDER_SITE_OTHER): Payer: BC Managed Care – PPO | Admitting: Family Medicine

## 2018-04-14 ENCOUNTER — Encounter: Payer: Self-pay | Admitting: Family Medicine

## 2018-04-14 VITALS — BP 120/80 | HR 73 | Temp 97.8°F | Ht 67.75 in | Wt 186.5 lb

## 2018-04-14 DIAGNOSIS — Z7289 Other problems related to lifestyle: Secondary | ICD-10-CM

## 2018-04-14 DIAGNOSIS — Z789 Other specified health status: Secondary | ICD-10-CM | POA: Diagnosis not present

## 2018-04-14 DIAGNOSIS — Z23 Encounter for immunization: Secondary | ICD-10-CM

## 2018-04-14 DIAGNOSIS — E7800 Pure hypercholesterolemia, unspecified: Secondary | ICD-10-CM

## 2018-04-14 DIAGNOSIS — K5730 Diverticulosis of large intestine without perforation or abscess without bleeding: Secondary | ICD-10-CM | POA: Diagnosis not present

## 2018-04-14 DIAGNOSIS — Z0000 Encounter for general adult medical examination without abnormal findings: Secondary | ICD-10-CM

## 2018-04-14 NOTE — Assessment & Plan Note (Addendum)
Chronic, stable off meds. Reviewed diet changes to improve cholesterol control.  The 10-year ASCVD risk score [name], et al., 2013) is: 9.1%   Values used to calculate the score:     Age: 62 years     Sex: Male     Is Non-Hispanic African American: No     Diabetic: No     Tobacco smoker: No     Systolic Blood Pressure: 308 mmHg     Is BP treated: No     HDL Cholesterol: 58.7 mg/dL     Total Cholesterol: 228 mg/dL

## 2018-04-14 NOTE — Assessment & Plan Note (Signed)
Preventative protocols reviewed and updated unless pt declined. Discussed healthy diet and lifestyle.  

## 2018-04-14 NOTE — Assessment & Plan Note (Signed)
Continues cutting down. 

## 2018-04-14 NOTE — Addendum Note (Signed)
Addended by: Brenton Grills on: 01/16/8881 80:03 PM   Modules accepted: Orders

## 2018-04-14 NOTE — Patient Instructions (Addendum)
Tdap today You are doing well today Return as needed or in 1 year for next physical.  Health Maintenance, Male A healthy lifestyle and preventive care is important for your health and wellness. Ask your health care provider about what schedule of regular examinations is right for you. What should I know about weight and diet? Eat a Healthy Diet  Eat plenty of vegetables, fruits, whole grains, low-fat dairy products, and lean protein.  Do not eat a lot of foods high in solid fats, added sugars, or salt.  Maintain a Healthy Weight Regular exercise can help you achieve or maintain a healthy weight. You should:  Do at least 150 minutes of exercise each week. The exercise should increase your heart rate and make you sweat (moderate-intensity exercise).  Do strength-training exercises at least twice a week.  Watch Your Levels of Cholesterol and Blood Lipids  Have your blood tested for lipids and cholesterol every 5 years starting at 61 years of age. If you are at high risk for heart disease, you should start having your blood tested when you are 62 years old. You may need to have your cholesterol levels checked more often if: ? Your lipid or cholesterol levels are high. ? You are older than 62 years of age. ? You are at high risk for heart disease.  What should I know about cancer screening? Many types of cancers can be detected early and may often be prevented. Lung Cancer  You should be screened every year for lung cancer if: ? You are a current smoker who has smoked for at least 30 years. ? You are a former smoker who has quit within the past 15 years.  Talk to your health care provider about your screening options, when you should start screening, and how often you should be screened.  Colorectal Cancer  Routine colorectal cancer screening usually begins at 62 years of age and should be repeated every 5-10 years until you are 62 years old. You may need to be screened more often  if early forms of precancerous polyps or small growths are found. Your health care provider may recommend screening at an earlier age if you have risk factors for colon cancer.  Your health care provider may recommend using home test kits to check for hidden blood in the stool.  A small camera at the end of a tube can be used to examine your colon (sigmoidoscopy or colonoscopy). This checks for the earliest forms of colorectal cancer.  Prostate and Testicular Cancer  Depending on your age and overall health, your health care provider may do certain tests to screen for prostate and testicular cancer.  Talk to your health care provider about any symptoms or concerns you have about testicular or prostate cancer.  Skin Cancer  Check your skin from head to toe regularly.  Tell your health care provider about any new moles or changes in moles, especially if: ? There is a change in a mole's size, shape, or color. ? You have a mole that is larger than a pencil eraser.  Always use sunscreen. Apply sunscreen liberally and repeat throughout the day.  Protect yourself by wearing long sleeves, pants, a wide-brimmed hat, and sunglasses when outside.  What should I know about heart disease, diabetes, and high blood pressure?  If you are 72-65 years of age, have your blood pressure checked every 3-5 years. If you are 79 years of age or older, have your blood pressure checked every year. You  should have your blood pressure measured twice-once when you are at a hospital or clinic, and once when you are not at a hospital or clinic. Record the average of the two measurements. To check your blood pressure when you are not at a hospital or clinic, you can use: ? An automated blood pressure machine at a pharmacy. ? A home blood pressure monitor.  Talk to your health care provider about your target blood pressure.  If you are between 3-57 years old, ask your health care provider if you should take aspirin  to prevent heart disease.  Have regular diabetes screenings by checking your fasting blood sugar level. ? If you are at a normal weight and have a low risk for diabetes, have this test once every three years after the age of 34. ? If you are overweight and have a high risk for diabetes, consider being tested at a younger age or more often.  A one-time screening for abdominal aortic aneurysm (AAA) by ultrasound is recommended for men aged 52-75 years who are current or former smokers. What should I know about preventing infection? Hepatitis B If you have a higher risk for hepatitis B, you should be screened for this virus. Talk with your health care provider to find out if you are at risk for hepatitis B infection. Hepatitis C Blood testing is recommended for:  Everyone born from [birth year] through 1965.  Anyone with known risk factors for hepatitis C.  Sexually Transmitted Diseases (STDs)  You should be screened each year for STDs including gonorrhea and chlamydia if: ? You are sexually active and are younger than 62 years of age. ? You are older than 62 years of age and your health care provider tells you that you are at risk for this type of infection. ? Your sexual activity has changed since you were last screened and you are at an increased risk for chlamydia or gonorrhea. Ask your health care provider if you are at risk.  Talk with your health care provider about whether you are at high risk of being infected with HIV. Your health care provider may recommend a prescription medicine to help prevent HIV infection.  What else can I do?  Schedule regular health, dental, and eye exams.  Stay current with your vaccines (immunizations).  Do not use any tobacco products, such as cigarettes, chewing tobacco, and e-cigarettes. If you need help quitting, ask your health care provider.  Limit alcohol intake to no more than 2 drinks per day. One drink equals 12 ounces of beer, 5 ounces of wine,  or 1 ounces of hard liquor.  Do not use street drugs.  Do not share needles.  Ask your health care provider for help if you need support or information about quitting drugs.  Tell your health care provider if you often feel depressed.  Tell your health care provider if you have ever been abused or do not feel safe at home. This information is not intended to replace advice given to you by your health care provider. Make sure you discuss any questions you have with your health care provider. Document Released: 02/29/2008 Document Revised: 05/01/2016 Document Reviewed: 06/06/2015 Elsevier Interactive Patient Education  [employer].

## 2018-04-14 NOTE — Progress Notes (Signed)
BP 120/80 (BP Location: Left Arm, Patient Position: Sitting, Cuff Size: Normal)   Pulse 73   Temp 97.8 F (36.6 C) (Oral)   Ht 5' 7.75" (1.721 m)   Wt 186 lb 8 oz (84.6 kg)   SpO2 96%   BMI 28.57 kg/m    CC: CPE Subjective:    Patient ID: Adam Daniels, male    DOB: 16-Oct-1955, 62 y.o.   MRN: [id number]  HPI: Adam Daniels is a 62 y.o. male presenting on 04/14/2018 for Annual Exam (Pt accompanied by his partner, [name].)   To see dentist this afternoon to have tooth pulled  Preventative: COLONOSCOPY Date: 02/2017 [name]) severe diverticulosis, rpt 10 yrs  Prostate cancer screening - he does have fmhx prostate cancer (father) so we have recommended yearly screening. Will defer DRE this year given stability. No nocturia, has strong stream.  Flu yearly Td - 2007, Tdap today  Seat belt use discussed.  Sunscreen use discussed. Denies changing moles.  Ex smoker - quit remotely (1990) Alcohol - a few days a week  Dentist - Q6 mo Eye exam - due for this  Caffeine: 1 cup coffee/day  Lives with wife, 2 dogs  Occupation: heating and air - retired  Activity: does walk at [employer] Diet: fruits/vegetables daily, good water - good chicken and fish  Relevant past medical, surgical, family and social history reviewed and updated as indicated. Interim medical history since our last visit reviewed. Allergies and medications reviewed and updated. No outpatient medications prior to visit.   Facility-Administered Medications Prior to Visit  Medication Dose Route Frequency Provider Last Rate Last Dose  . 0.9 %  sodium chloride infusion  500 mL Intravenous Continuous Gatha Mayer, MD         Per HPI unless specifically indicated in ROS section below Review of Systems  Constitutional: Negative for activity change, appetite change, chills, fatigue, fever and unexpected weight change.  HENT: Negative for hearing loss.   Eyes: Negative for visual disturbance.  Respiratory: Negative  for cough, chest tightness, shortness of breath and wheezing.   Cardiovascular: Negative for chest pain, palpitations and leg swelling.  Gastrointestinal: Negative for abdominal distention, abdominal pain, blood in stool, constipation, diarrhea, nausea and vomiting.  Genitourinary: Negative for difficulty urinating and hematuria.  Musculoskeletal: Negative for arthralgias, myalgias and neck pain.  Skin: Negative for rash.  Neurological: Negative for dizziness, seizures, syncope and headaches.  Hematological: Negative for adenopathy. Does not bruise/bleed easily.  Psychiatric/Behavioral: Negative for dysphoric mood. The patient is not nervous/anxious.        Objective:    BP 120/80 (BP Location: Left Arm, Patient Position: Sitting, Cuff Size: Normal)   Pulse 73   Temp 97.8 F (36.6 C) (Oral)   Ht 5' 7.75" (1.721 m)   Wt 186 lb 8 oz (84.6 kg)   SpO2 96%   BMI 28.57 kg/m   Wt Readings from Last 3 Encounters:  04/14/18 186 lb 8 oz (84.6 kg)  03/28/17 189 lb (85.7 kg)  03/12/17 193 lb (87.5 kg)    Physical Exam  Constitutional: He is oriented to person, place, and time. He appears well-developed and well-nourished. No distress.  HENT:  Head: Normocephalic and atraumatic.  Right Ear: Hearing, tympanic membrane, external ear and ear canal normal.  Left Ear: Hearing, tympanic membrane, external ear and ear canal normal.  Nose: Nose normal.  Mouth/Throat: Uvula is midline, oropharynx is clear and moist and mucous membranes are normal. No oropharyngeal exudate, posterior  oropharyngeal edema or posterior oropharyngeal erythema.  Eyes: Pupils are equal, round, and reactive to light. Conjunctivae and EOM are normal. No scleral icterus.  Neck: Normal range of motion. Neck supple. No thyromegaly present.  Cardiovascular: Normal rate, regular rhythm, normal heart sounds and intact distal pulses.  No murmur heard. Pulses:      Radial pulses are 2+ on the right side, and 2+ on the left side.    Pulmonary/Chest: Effort normal and breath sounds normal. No respiratory distress. He has no wheezes. He has no rales.  Abdominal: Soft. Bowel sounds are normal. He exhibits no distension and no mass. There is no tenderness. There is no rebound and no guarding.  Genitourinary: Rectum normal and prostate normal. Rectal exam shows no external hemorrhoid, no internal hemorrhoid, no fissure, no mass, no tenderness and anal tone normal. Prostate is not enlarged (20gm) and not tender.  Musculoskeletal: Normal range of motion. He exhibits no edema.  Lymphadenopathy:    He has no cervical adenopathy.  Neurological: He is alert and oriented to person, place, and time.  CN grossly intact, station and gait intact  Skin: Skin is warm and dry. No rash noted.  Psychiatric: He has a normal mood and affect. His behavior is normal. Judgment and thought content normal.  Nursing note and vitals reviewed.  Results for orders placed or performed in visit on 04/08/18  PSA  Result Value Ref Range   PSA 1.70 0.10 - 4.00 ng/mL  Lipid panel  Result Value Ref Range   Cholesterol 228 (H) 0 - 200 mg/dL   Triglycerides 120.0 0.0 - 149.0 mg/dL   HDL 58.70 >39.00 mg/dL   VLDL 24.0 0.0 - 40.0 mg/dL   LDL Cholesterol 146 (H) 0 - 99 mg/dL   Total CHOL/HDL Ratio 4    NonHDL 169.78   Comprehensive metabolic panel  Result Value Ref Range   Sodium 137 135 - 145 mEq/L   Potassium 4.5 3.5 - 5.1 mEq/L   Chloride 101 96 - 112 mEq/L   CO2 29 19 - 32 mEq/L   Glucose, Bld 120 (H) 70 - 99 mg/dL   BUN 12 6 - 23 mg/dL   Creatinine, Ser 0.80 0.40 - 1.50 mg/dL   Total Bilirubin 0.7 0.2 - 1.2 mg/dL   Alkaline Phosphatase 95 39 - 117 U/L   AST 17 0 - 37 U/L   ALT 24 0 - 53 U/L   Total Protein 7.0 6.0 - 8.3 g/dL   Albumin 4.3 3.5 - 5.2 g/dL   Calcium 9.4 8.4 - 10.5 mg/dL   GFR 103.94 >60.00 mL/min  Vitamin B12  Result Value Ref Range   Vitamin B-12 465 211 - 911 pg/mL      Assessment & Plan:   Problem List Items  Addressed This Visit    HYPERCHOLESTEROLEMIA    Chronic, stable off meds. Reviewed diet changes to improve cholesterol control.  The 10-year ASCVD risk score [name], et al., 2013) is: 9.1%   Values used to calculate the score:     Age: 40 years     Sex: Male     Is Non-Hispanic African American: No     Diabetic: No     Tobacco smoker: No     Systolic Blood Pressure: 240 mmHg     Is BP treated: No     HDL Cholesterol: 58.7 mg/dL     Total Cholesterol: 228 mg/dL       Healthcare maintenance - Primary  Preventative protocols reviewed and updated unless pt declined. Discussed healthy diet and lifestyle.       Diverticulosis of colon    Reviewed symptoms of diverticulitis to seek care.       Alcohol use ([place])    Continues cutting down          No orders of the defined types were placed in this encounter.  No orders of the defined types were placed in this encounter.   Follow up plan: Return in about 1 year (around 04/15/2019) for annual exam, prior fasting for blood work.  [name], MD

## 2018-04-14 NOTE — Assessment & Plan Note (Signed)
Reviewed symptoms of diverticulitis to seek care.

## 2019-03-26 ENCOUNTER — Ambulatory Visit: Payer: BC Managed Care – PPO | Admitting: Family Medicine

## 2019-04-18 ENCOUNTER — Other Ambulatory Visit: Payer: Self-pay | Admitting: Family Medicine

## 2019-04-18 DIAGNOSIS — Z125 Encounter for screening for malignant neoplasm of prostate: Secondary | ICD-10-CM

## 2019-04-18 DIAGNOSIS — E7800 Pure hypercholesterolemia, unspecified: Secondary | ICD-10-CM

## 2019-04-19 ENCOUNTER — Other Ambulatory Visit: Payer: Self-pay

## 2019-04-19 ENCOUNTER — Other Ambulatory Visit (INDEPENDENT_AMBULATORY_CARE_PROVIDER_SITE_OTHER): Payer: BC Managed Care – PPO

## 2019-04-19 DIAGNOSIS — E7800 Pure hypercholesterolemia, unspecified: Secondary | ICD-10-CM

## 2019-04-19 DIAGNOSIS — Z125 Encounter for screening for malignant neoplasm of prostate: Secondary | ICD-10-CM | POA: Diagnosis not present

## 2019-04-19 LAB — LIPID PANEL
Cholesterol: 205 mg/dL — ABNORMAL HIGH (ref 0–200)
HDL: 59.3 mg/dL (ref >39.00)
LDL Cholesterol: 119 mg/dL — ABNORMAL HIGH (ref 0–99)
NonHDL: 146.1
Total CHOL/HDL Ratio: 3
Triglycerides: 134 mg/dL (ref 0.0–149.0)
VLDL: 26.8 mg/dL (ref 0.0–40.0)

## 2019-04-19 LAB — COMPREHENSIVE METABOLIC PANEL
ALT: 17 U/L (ref 0–53)
AST: 15 U/L (ref 0–37)
Albumin: 4.2 g/dL (ref 3.5–5.2)
Alkaline Phosphatase: 89 U/L (ref 39–117)
BUN: 22 mg/dL (ref 6–23)
CO2: 29 mEq/L (ref 19–32)
Calcium: 9.6 mg/dL (ref 8.4–10.5)
Chloride: 103 mEq/L (ref 96–112)
Creatinine, Ser: 0.8 mg/dL (ref 0.40–1.50)
GFR: 97.47 mL/min (ref >60.00)
Glucose, Bld: 89 mg/dL (ref 70–99)
Potassium: 4.4 mEq/L (ref 3.5–5.1)
Sodium: 141 mEq/L (ref 135–145)
Total Bilirubin: 0.6 mg/dL (ref 0.2–1.2)
Total Protein: 6.6 g/dL (ref 6.0–8.3)

## 2019-04-19 LAB — TSH: TSH: 1.54 u[IU]/mL (ref 0.35–4.50)

## 2019-04-19 LAB — PSA: PSA: 1.63 ng/mL (ref 0.10–4.00)

## 2019-04-23 ENCOUNTER — Encounter: Payer: Self-pay | Admitting: Family Medicine

## 2019-04-23 ENCOUNTER — Ambulatory Visit (INDEPENDENT_AMBULATORY_CARE_PROVIDER_SITE_OTHER): Payer: BC Managed Care – PPO | Admitting: Family Medicine

## 2019-04-23 VITALS — Ht 67.75 in | Wt 184.0 lb

## 2019-04-23 DIAGNOSIS — Z0000 Encounter for general adult medical examination without abnormal findings: Secondary | ICD-10-CM | POA: Diagnosis not present

## 2019-04-23 DIAGNOSIS — Z7289 Other problems related to lifestyle: Secondary | ICD-10-CM | POA: Diagnosis not present

## 2019-04-23 DIAGNOSIS — Z789 Other specified health status: Secondary | ICD-10-CM

## 2019-04-23 DIAGNOSIS — E7800 Pure hypercholesterolemia, unspecified: Secondary | ICD-10-CM

## 2019-04-23 NOTE — Assessment & Plan Note (Signed)
Preventative protocols reviewed and updated unless pt declined. Discussed healthy diet and lifestyle.  

## 2019-04-23 NOTE — Assessment & Plan Note (Addendum)
Improving readings. Goal LDL <100 given fmhx. The 10-year ASCVD risk score [name], et al., 2013) is: 9.3%   Values used to calculate the score:     Age: 63 years     Sex: Male     Is Non-Hispanic African American: No     Diabetic: No     Tobacco smoker: No     Systolic Blood Pressure: 421 mmHg     Is BP treated: No     HDL Cholesterol: 59.3 mg/dL     Total Cholesterol: 205 mg/dL

## 2019-04-23 NOTE — Assessment & Plan Note (Signed)
Stable period without excess use.

## 2019-04-23 NOTE — Progress Notes (Signed)
Virtual visit completed through [employer]. Due to national recommendations of social distancing due to COVID-19, a virtual visit is felt to be most appropriate for this patient at this time. Reviewed limitations of a virtual visit.   Patient location: home Provider location: [place] at [facility], office If any vitals were documented, they were collected by patient at home unless specified below.    Ht 5' 7.75" (1.721 m)   Wt 184 lb (83.5 kg)   BMI 28.18 kg/m   BP Readings from Last 3 Encounters:  04/14/18 120/80  03/28/17 108/60  03/12/17 128/80  Advised to check BP at local pharmacy  CC: CPE Subjective:    Patient ID: Adam Daniels, male    DOB: [birth year], 63 y.o.   MRN: [id number]  HPI: Adam Daniels is a 63 y.o. male presenting on 04/23/2019 for Annual Exam   Preventative: COLONOSCOPY Date:02/2017 [name]) severe diverticulosis, rpt 10 yrs Prostate cancer screening -he does havefmhx prostate cancer(father) so we have recommended yearly screening. Will defer DRE this year given stability. No nocturia, has strong stream.  Fluyearly  Td - 2007, Tdap 2019  Seat belt use discussed.  Sunscreen use discussed. Denies changing moles. Ex smoker - quit remotely (1990) Alcohol - a few days a week, 2-3 per sitting Dentist - Q6 mo Eye exam - saw 05/2018  Caffeine: 1 cup coffee/day  Lives with wife, 2 dogs  Occupation: heating and air- retired Activity: does walk at [employer]  Diet: fruits/vegetables daily, good water - good chicken and fish     Relevant past medical, surgical, family and social history reviewed and updated as indicated. Interim medical history since our last visit reviewed. Allergies and medications reviewed and updated. Outpatient Medications Prior to Visit  Medication Sig Dispense Refill  . etodolac (LODINE) 500 MG tablet Take 1 mg by mouth 2 (two) times daily.     No facility-administered medications prior to visit.      Per HPI unless  specifically indicated in ROS section below Review of Systems  Constitutional: Negative for activity change, appetite change, chills, fatigue, fever and unexpected weight change.  HENT: Negative for hearing loss.   Eyes: Negative for visual disturbance.  Respiratory: Negative for cough, chest tightness, shortness of breath and wheezing.   Cardiovascular: Negative for chest pain, palpitations and leg swelling.  Gastrointestinal: Negative for abdominal distention, abdominal pain (on lodine for this), blood in stool, constipation, diarrhea, nausea and vomiting.  Genitourinary: Negative for difficulty urinating and hematuria.  Musculoskeletal: Positive for back pain. Negative for arthralgias, myalgias and neck pain.  Skin: Negative for rash.  Neurological: Negative for dizziness, seizures, syncope and headaches.  Hematological: Negative for adenopathy. Does not bruise/bleed easily.  Psychiatric/Behavioral: Negative for dysphoric mood. The patient is not nervous/anxious.    Objective:    Ht 5' 7.75" (1.721 m)   Wt 184 lb (83.5 kg)   BMI 28.18 kg/m   Wt Readings from Last 3 Encounters:  04/23/19 184 lb (83.5 kg)  04/14/18 186 lb 8 oz (84.6 kg)  03/28/17 189 lb (85.7 kg)     Physical exam: Gen: alert, NAD, not ill appearing Pulm: speaks in complete sentences without increased work of breathing Psych: normal mood, normal thought content      Results for orders placed or performed in visit on 04/19/19  PSA  Result Value Ref Range   PSA 1.63 0.10 - 4.00 ng/mL  TSH  Result Value Ref Range   TSH 1.54 0.35 - 4.50 uIU/mL  Comprehensive metabolic panel  Result Value Ref Range   Sodium 141 135 - 145 mEq/L   Potassium 4.4 3.5 - 5.1 mEq/L   Chloride 103 96 - 112 mEq/L   CO2 29 19 - 32 mEq/L   Glucose, Bld 89 70 - 99 mg/dL   BUN 22 6 - 23 mg/dL   Creatinine, Ser 0.80 0.40 - 1.50 mg/dL   Total Bilirubin 0.6 0.2 - 1.2 mg/dL   Alkaline Phosphatase 89 39 - 117 U/L   AST 15 0 - 37 U/L    ALT 17 0 - 53 U/L   Total Protein 6.6 6.0 - 8.3 g/dL   Albumin 4.2 3.5 - 5.2 g/dL   Calcium 9.6 8.4 - 10.5 mg/dL   GFR 97.47 >60.00 mL/min  Lipid panel  Result Value Ref Range   Cholesterol 205 (H) 0 - 200 mg/dL   Triglycerides 134.0 0.0 - 149.0 mg/dL   HDL 59.30 >39.00 mg/dL   VLDL 26.8 0.0 - 40.0 mg/dL   LDL Cholesterol 119 (H) 0 - 99 mg/dL   Total CHOL/HDL Ratio 3    NonHDL 146.10    Assessment & Plan:   Problem List Items Addressed This Visit    HYPERCHOLESTEROLEMIA    Improving readings. Goal LDL <100 given fmhx. The 10-year ASCVD risk score [name], et al., 2013) is: 9.3%   Values used to calculate the score:     Age: 63 years     Sex: Male     Is Non-Hispanic African American: No     Diabetic: No     Tobacco smoker: No     Systolic Blood Pressure: 017 mmHg     Is BP treated: No     HDL Cholesterol: 59.3 mg/dL     Total Cholesterol: 205 mg/dL       Healthcare maintenance - Primary    Preventative protocols reviewed and updated unless pt declined. Discussed healthy diet and lifestyle.       Alcohol use    Stable period without excess use.          No orders of the defined types were placed in this encounter.  No orders of the defined types were placed in this encounter.   I discussed the assessment and treatment plan with the patient. The patient was provided an opportunity to ask questions and all were answered. The patient agreed with the plan and demonstrated an understanding of the instructions. The patient was advised to call back or seek an in-person evaluation if the symptoms worsen or if the condition fails to improve as anticipated.  Follow up plan: Return in about 1 year (around 04/22/2020) for annual exam, prior fasting for blood work.  [name], MD

## 2019-04-23 NOTE — Patient Instructions (Signed)
You are doing well today! Schedule physical in 1 year.   Health Maintenance, Male Adopting a healthy lifestyle and getting preventive care are important in promoting health and wellness. Ask your health care provider about:  The right schedule for you to have regular tests and exams.  Things you can do on your own to prevent diseases and keep yourself healthy. What should I know about diet, weight, and exercise? Eat a healthy diet   Eat a diet that includes plenty of vegetables, fruits, low-fat dairy products, and lean protein.  Do not eat a lot of foods that are high in solid fats, added sugars, or sodium. Maintain a healthy weight Body mass index (BMI) is a measurement that can be used to identify possible weight problems. It estimates body fat based on height and weight. Your health care provider can help determine your BMI and help you achieve or maintain a healthy weight. Get regular exercise Get regular exercise. This is one of the most important things you can do for your health. Most adults should:  Exercise for at least 150 minutes each week. The exercise should increase your heart rate and make you sweat (moderate-intensity exercise).  Do strengthening exercises at least twice a week. This is in addition to the moderate-intensity exercise.  Spend less time sitting. Even light physical activity can be beneficial. Watch cholesterol and blood lipids Have your blood tested for lipids and cholesterol at 63 years of age, then have this test every 5 years. You may need to have your cholesterol levels checked more often if:  Your lipid or cholesterol levels are high.  You are older than 63 years of age.  You are at high risk for heart disease. What should I know about cancer screening? Many types of cancers can be detected early and may often be prevented. Depending on your health history and family history, you may need to have cancer screening at various ages. This may include  screening for:  Colorectal cancer.  Prostate cancer.  Skin cancer.  Lung cancer. What should I know about heart disease, diabetes, and high blood pressure? Blood pressure and heart disease  High blood pressure causes heart disease and increases the risk of stroke. This is more likely to develop in people who have high blood pressure readings, are of African descent, or are overweight.  Talk with your health care provider about your target blood pressure readings.  Have your blood pressure checked: ? Every 3-5 years if you are 57-66 years of age. ? Every year if you are 32 years old or older.  If you are between the ages of 45 and 20 and are a current or former smoker, ask your health care provider if you should have a one-time screening for abdominal aortic aneurysm (AAA). Diabetes Have regular diabetes screenings. This checks your fasting blood sugar level. Have the screening done:  Once every three years after age 65 if you are at a normal weight and have a low risk for diabetes.  More often and at a younger age if you are overweight or have a high risk for diabetes. What should I know about preventing infection? Hepatitis B If you have a higher risk for hepatitis B, you should be screened for this virus. Talk with your health care provider to find out if you are at risk for hepatitis B infection. Hepatitis C Blood testing is recommended for:  Everyone born from [birth year] through 1965.  Anyone with known risk factors for hepatitis  C. Sexually transmitted infections (STIs)  You should be screened each year for STIs, including gonorrhea and chlamydia, if: ? You are sexually active and are younger than 63 years of age. ? You are older than 63 years of age and your health care provider tells you that you are at risk for this type of infection. ? Your sexual activity has changed since you were last screened, and you are at increased risk for chlamydia or gonorrhea. Ask your health  care provider if you are at risk.  Ask your health care provider about whether you are at high risk for HIV. Your health care provider may recommend a prescription medicine to help prevent HIV infection. If you choose to take medicine to prevent HIV, you should first get tested for HIV. You should then be tested every 3 months for as long as you are taking the medicine. Follow these instructions at home: Lifestyle  Do not use any products that contain nicotine or tobacco, such as cigarettes, e-cigarettes, and chewing tobacco. If you need help quitting, ask your health care provider.  Do not use street drugs.  Do not share needles.  Ask your health care provider for help if you need support or information about quitting drugs. Alcohol use  Do not drink alcohol if your health care provider tells you not to drink.  If you drink alcohol: ? Limit how much you have to 0-2 drinks a day. ? Be aware of how much alcohol is in your drink. In the [place] one drink equals one 12 oz bottle of beer (355 mL), one 5 oz glass of wine (148 mL), or one 1 oz glass of hard liquor (44 mL). General instructions  Schedule regular health, dental, and eye exams.  Stay current with your vaccines.  Tell your health care provider if: ? You often feel depressed. ? You have ever been abused or do not feel safe at home. Summary  Adopting a healthy lifestyle and getting preventive care are important in promoting health and wellness.  Follow your health care provider's instructions about healthy diet, exercising, and getting tested or screened for diseases.  Follow your health care provider's instructions on monitoring your cholesterol and blood pressure. This information is not intended to replace advice given to you by your health care provider. Make sure you discuss any questions you have with your health care provider. Document Released: 02/29/2008 Document Revised: 08/26/2018 Document Reviewed: 08/26/2018  Elsevier Patient Education  2020 [employer].

## 2019-12-10 ENCOUNTER — Ambulatory Visit: Payer: BC Managed Care – PPO | Attending: Internal Medicine

## 2019-12-10 DIAGNOSIS — Z23 Encounter for immunization: Secondary | ICD-10-CM

## 2019-12-10 NOTE — Progress Notes (Signed)
   Covid-19 Vaccination Clinic  Name:  Adam Daniels    MRN: [id number] DOB: 1955/09/29  12/10/2019  [name] was observed post Covid-19 immunization for 15 minutes without incident. He was provided with Vaccine Information Sheet and instruction to access the V-Safe system.   [name] was instructed to call 911 with any severe reactions post vaccine: Marland Kitchen Difficulty breathing  . Swelling of face and throat  . A fast heartbeat  . A bad rash all over body  . Dizziness and weakness   Immunizations Administered    Name Date Dose VIS Date Route   Pfizer COVID-19 Vaccine 12/10/2019  2:11 PM 0.3 mL 08/27/2019 Intramuscular   Manufacturer: [place]   Lot: [id number]   [place]: [id number]

## 2020-01-04 ENCOUNTER — Ambulatory Visit: Payer: BC Managed Care – PPO | Attending: Internal Medicine

## 2020-01-04 DIAGNOSIS — Z23 Encounter for immunization: Secondary | ICD-10-CM

## 2020-01-04 NOTE — Progress Notes (Signed)
   Covid-19 Vaccination Clinic  Name:  Adam Daniels    MRN: [id number] DOB: Nov 22, 1955  01/04/2020  Mr. Adam Daniels was observed post Covid-19 immunization for 15 minutes without incident. He was provided with Vaccine Information Sheet and instruction to access the V-Safe system.   Mr. Adam Daniels was instructed to call 911 with any severe reactions post vaccine: Marland Kitchen Difficulty breathing  . Swelling of face and throat  . A fast heartbeat  . A bad rash all over body  . Dizziness and weakness   Immunizations Administered    Name Date Dose VIS Date Route   Pfizer COVID-19 Vaccine 01/04/2020  1:01 PM 0.3 mL 11/10/2018 Intramuscular   Manufacturer: [place]   Lot: [id number]   [place]: [id number]

## 2020-02-15 DEATH — deceased

## 2020-02-17 ENCOUNTER — Other Ambulatory Visit: Payer: Self-pay | Admitting: Family Medicine

## 2020-02-17 DIAGNOSIS — E7800 Pure hypercholesterolemia, unspecified: Secondary | ICD-10-CM

## 2020-02-17 DIAGNOSIS — Z125 Encounter for screening for malignant neoplasm of prostate: Secondary | ICD-10-CM

## 2020-02-18 ENCOUNTER — Other Ambulatory Visit (INDEPENDENT_AMBULATORY_CARE_PROVIDER_SITE_OTHER): Payer: BC Managed Care – PPO

## 2020-02-18 ENCOUNTER — Other Ambulatory Visit: Payer: Self-pay

## 2020-02-18 DIAGNOSIS — Z125 Encounter for screening for malignant neoplasm of prostate: Secondary | ICD-10-CM | POA: Diagnosis not present

## 2020-02-18 DIAGNOSIS — E7800 Pure hypercholesterolemia, unspecified: Secondary | ICD-10-CM | POA: Diagnosis not present

## 2020-02-18 LAB — LIPID PANEL
Cholesterol: 246 mg/dL — ABNORMAL HIGH (ref 0–200)
HDL: 50 mg/dL (ref >39.00)
LDL Cholesterol: 167 mg/dL — ABNORMAL HIGH (ref 0–99)
NonHDL: 195.7
Total CHOL/HDL Ratio: 5
Triglycerides: 145 mg/dL (ref 0.0–149.0)
VLDL: 29 mg/dL (ref 0.0–40.0)

## 2020-02-18 LAB — COMPREHENSIVE METABOLIC PANEL
ALT: 24 U/L (ref 0–53)
AST: 17 U/L (ref 0–37)
Albumin: 4.3 g/dL (ref 3.5–5.2)
Alkaline Phosphatase: 86 U/L (ref 39–117)
BUN: 20 mg/dL (ref 6–23)
CO2: 28 mEq/L (ref 19–32)
Calcium: 9.4 mg/dL (ref 8.4–10.5)
Chloride: 101 mEq/L (ref 96–112)
Creatinine, Ser: 0.84 mg/dL (ref 0.40–1.50)
GFR: 91.89 mL/min (ref >60.00)
Glucose, Bld: 98 mg/dL (ref 70–99)
Potassium: 4.4 mEq/L (ref 3.5–5.1)
Sodium: 136 mEq/L (ref 135–145)
Total Bilirubin: 0.7 mg/dL (ref 0.2–1.2)
Total Protein: 6.6 g/dL (ref 6.0–8.3)

## 2020-02-18 LAB — PSA: PSA: 1.45 ng/mL (ref 0.10–4.00)

## 2020-02-23 ENCOUNTER — Ambulatory Visit (INDEPENDENT_AMBULATORY_CARE_PROVIDER_SITE_OTHER): Payer: BC Managed Care – PPO | Admitting: Family Medicine

## 2020-02-23 ENCOUNTER — Encounter: Payer: Self-pay | Admitting: Family Medicine

## 2020-02-23 ENCOUNTER — Other Ambulatory Visit: Payer: Self-pay

## 2020-02-23 VITALS — BP 118/78 | HR 81 | Temp 97.6°F | Ht 68.0 in | Wt 191.5 lb

## 2020-02-23 DIAGNOSIS — E7800 Pure hypercholesterolemia, unspecified: Secondary | ICD-10-CM

## 2020-02-23 MED ORDER — ATORVASTATIN CALCIUM 20 MG PO TABS: 20.0000 mg | ORAL_TABLET | Freq: Every day | ORAL | 3 refills | Status: DC

## 2020-02-23 NOTE — Progress Notes (Signed)
Patient here for CPE, too early as not within a year from prior.  I have asked him to return in 2 months for his CPE.  Did review labs - LDL above goal, given family history do recommend trial atorvastatin 20mg  daily. Will reassess with rpt labs at CPE in 2 months.   The 10-year ASCVD risk score Adam Daniels., et al., 2013) is: 12%   Values used to calculate the score:     Age: 64 years     Sex: Male     Is Non-Hispanic African American: No     Diabetic: No     Tobacco smoker: No     Systolic Blood Pressure: 702 mmHg     Is BP treated: No     HDL Cholesterol: 50 mg/dL     Total Cholesterol: 246 mg/dL

## 2020-04-20 ENCOUNTER — Other Ambulatory Visit: Payer: Self-pay | Admitting: Family Medicine

## 2020-04-20 ENCOUNTER — Other Ambulatory Visit (INDEPENDENT_AMBULATORY_CARE_PROVIDER_SITE_OTHER): Payer: BC Managed Care – PPO

## 2020-04-20 ENCOUNTER — Other Ambulatory Visit: Payer: Self-pay

## 2020-04-20 DIAGNOSIS — E7800 Pure hypercholesterolemia, unspecified: Secondary | ICD-10-CM | POA: Diagnosis not present

## 2020-04-20 LAB — HEPATIC FUNCTION PANEL
ALT: 43 U/L (ref 0–53)
AST: 32 U/L (ref 0–37)
Albumin: 4.2 g/dL (ref 3.5–5.2)
Alkaline Phosphatase: 111 U/L (ref 39–117)
Bilirubin, Direct: 0.1 mg/dL (ref 0.0–0.3)
Total Bilirubin: 0.9 mg/dL (ref 0.2–1.2)
Total Protein: 7.1 g/dL (ref 6.0–8.3)

## 2020-04-20 LAB — LIPID PANEL
Cholesterol: 148 mg/dL (ref 0–200)
HDL: 52.2 mg/dL (ref >39.00)
LDL Cholesterol: 74 mg/dL (ref 0–99)
NonHDL: 95.79
Total CHOL/HDL Ratio: 3
Triglycerides: 109 mg/dL (ref 0.0–149.0)
VLDL: 21.8 mg/dL (ref 0.0–40.0)

## 2020-04-27 ENCOUNTER — Telehealth: Payer: Self-pay | Admitting: Family Medicine

## 2020-04-27 ENCOUNTER — Encounter: Payer: Self-pay | Admitting: Family Medicine

## 2020-04-27 ENCOUNTER — Other Ambulatory Visit: Payer: Self-pay

## 2020-04-27 ENCOUNTER — Ambulatory Visit (INDEPENDENT_AMBULATORY_CARE_PROVIDER_SITE_OTHER): Payer: BC Managed Care – PPO | Admitting: Family Medicine

## 2020-04-27 VITALS — BP 128/86 | HR 78 | Temp 97.9°F | Ht 68.0 in | Wt 195.0 lb

## 2020-04-27 DIAGNOSIS — Z0000 Encounter for general adult medical examination without abnormal findings: Secondary | ICD-10-CM | POA: Diagnosis not present

## 2020-04-27 DIAGNOSIS — E7800 Pure hypercholesterolemia, unspecified: Secondary | ICD-10-CM

## 2020-04-27 DIAGNOSIS — Z7289 Other problems related to lifestyle: Secondary | ICD-10-CM

## 2020-04-27 DIAGNOSIS — E663 Overweight: Secondary | ICD-10-CM | POA: Diagnosis not present

## 2020-04-27 DIAGNOSIS — Z789 Other specified health status: Secondary | ICD-10-CM

## 2020-04-27 MED ORDER — ATORVASTATIN CALCIUM 20 MG PO TABS: 20.0000 mg | ORAL_TABLET | Freq: Every day | ORAL | 1 refills | Status: DC

## 2020-04-27 MED ORDER — CO-ENZYME Q-10 50 MG PO CAPS: 50.0000 mg | ORAL_CAPSULE | Freq: Two times a day (BID) | ORAL | Status: DC

## 2020-04-27 NOTE — Assessment & Plan Note (Signed)
Preventative protocols reviewed and updated unless pt declined. Discussed healthy diet and lifestyle.  

## 2020-04-27 NOTE — Assessment & Plan Note (Addendum)
Marked improvement with atorvastatin. Continue. He is hopeful to lose weight and complete healthy diet/lifestyle changes where he won't need statin. He does have significant fmhx CAD and stroke however.  The 10-year ASCVD risk score [name], et al., 2013) is: 9.2%   Values used to calculate the score:     Age: 64 years     Sex: Male     Is Non-Hispanic African American: No     Diabetic: No     Tobacco smoker: No     Systolic Blood Pressure: 462 mmHg     Is BP treated: No     HDL Cholesterol: 52.2 mg/dL     Total Cholesterol: 148 mg/dL

## 2020-04-27 NOTE — Patient Instructions (Addendum)
Cholesterol levels were so much better! Continue atorvastatin (lipitor). Work towards weight loss - goal 180-185 lbs.  Consider coenzyme Q10 supplement at 50mg  1-2 a day as muscle supplement which may help leg symptom if they're coming from atorvastatin (lipitor).  Consider shingles vaccine 2 shot series (shingrix) - let us know if interested.  You are doing well today Return as needed or in 1 year for next physical. Call us when you get medicare to schedule a Welcome to Medicare visit.   Health Maintenance, Male Adopting a healthy lifestyle and getting preventive care are important in promoting health and wellness. Ask your health care provider about:  The right schedule for you to have regular tests and exams.  Things you can do on your own to prevent diseases and keep yourself healthy. What should I know about diet, weight, and exercise? Eat a healthy diet   Eat a diet that includes plenty of vegetables, fruits, low-fat dairy products, and lean protein.  Do not eat a lot of foods that are high in solid fats, added sugars, or sodium. Maintain a healthy weight Body mass index (BMI) is a measurement that can be used to identify possible weight problems. It estimates body fat based on height and weight. Your health care provider can help determine your BMI and help you achieve or maintain a healthy weight. Get regular exercise Get regular exercise. This is one of the most important things you can do for your health. Most adults should:  Exercise for at least 150 minutes each week. The exercise should increase your heart rate and make you sweat (moderate-intensity exercise).  Do strengthening exercises at least twice a week. This is in addition to the moderate-intensity exercise.  Spend less time sitting. Even light physical activity can be beneficial. Watch cholesterol and blood lipids Have your blood tested for lipids and cholesterol at 64 years of age, then have this test every 5  years. You may need to have your cholesterol levels checked more often if:  Your lipid or cholesterol levels are high.  You are older than 64 years of age.  You are at high risk for heart disease. What should I know about cancer screening? Many types of cancers can be detected early and may often be prevented. Depending on your health history and family history, you may need to have cancer screening at various ages. This may include screening for:  Colorectal cancer.  Prostate cancer.  Skin cancer.  Lung cancer. What should I know about heart disease, diabetes, and high blood pressure? Blood pressure and heart disease  High blood pressure causes heart disease and increases the risk of stroke. This is more likely to develop in people who have high blood pressure readings, are of African descent, or are overweight.  Talk with your health care provider about your target blood pressure readings.  Have your blood pressure checked: ? Every 3-5 years if you are 34-75 years of age. ? Every year if you are 59 years old or older.  If you are between the ages of 21 and 38 and are a current or former smoker, ask your health care provider if you should have a one-time screening for abdominal aortic aneurysm (AAA). Diabetes Have regular diabetes screenings. This checks your fasting blood sugar level. Have the screening done:  Once every three years after age 26 if you are at a normal weight and have a low risk for diabetes.  More often and at a younger age if you  are overweight or have a high risk for diabetes. What should I know about preventing infection? Hepatitis B If you have a higher risk for hepatitis B, you should be screened for this virus. Talk with your health care provider to find out if you are at risk for hepatitis B infection. Hepatitis C Blood testing is recommended for:  Everyone born from [birth year] through 1965.  Anyone with known risk factors for hepatitis C. Sexually  transmitted infections (STIs)  You should be screened each year for STIs, including gonorrhea and chlamydia, if: ? You are sexually active and are younger than 64 years of age. ? You are older than 64 years of age and your health care provider tells you that you are at risk for this type of infection. ? Your sexual activity has changed since you were last screened, and you are at increased risk for chlamydia or gonorrhea. Ask your health care provider if you are at risk.  Ask your health care provider about whether you are at high risk for HIV. Your health care provider may recommend a prescription medicine to help prevent HIV infection. If you choose to take medicine to prevent HIV, you should first get tested for HIV. You should then be tested every 3 months for as long as you are taking the medicine. Follow these instructions at home: Lifestyle  Do not use any products that contain nicotine or tobacco, such as cigarettes, e-cigarettes, and chewing tobacco. If you need help quitting, ask your health care provider.  Do not use street drugs.  Do not share needles.  Ask your health care provider for help if you need support or information about quitting drugs. Alcohol use  Do not drink alcohol if your health care provider tells you not to drink.  If you drink alcohol: ? Limit how much you have to 0-2 drinks a day. ? Be aware of how much alcohol is in your drink. In the [place] one drink equals one 12 oz bottle of beer (355 mL), one 5 oz glass of wine (148 mL), or one 1 oz glass of hard liquor (44 mL). General instructions  Schedule regular health, dental, and eye exams.  Stay current with your vaccines.  Tell your health care provider if: ? You often feel depressed. ? You have ever been abused or do not feel safe at home. Summary  Adopting a healthy lifestyle and getting preventive care are important in promoting health and wellness.  Follow your health care provider's  instructions about healthy diet, exercising, and getting tested or screened for diseases.  Follow your health care provider's instructions on monitoring your cholesterol and blood pressure. This information is not intended to replace advice given to you by your health care provider. Make sure you discuss any questions you have with your health care provider. Document Revised: 08/26/2018 Document Reviewed: 08/26/2018 Elsevier Patient Education  2020 [employer].

## 2020-04-27 NOTE — Telephone Encounter (Signed)
Spoken to patient and confirm the medications that were discuss with Dr Danise Mina.  Patient [facility] [address]. Sent as requested.

## 2020-04-27 NOTE — Assessment & Plan Note (Signed)
Discussed weight goal of 180-185lbs to start.

## 2020-04-27 NOTE — Progress Notes (Signed)
This visit was conducted in person.  BP 128/86   Pulse 78   Temp 97.9 F (36.6 C)   Ht 5\' 8"  (1.727 m)   Wt 195 lb (88.5 kg)   SpO2 98%   BMI 29.65 kg/m    CC: CPE Subjective:    Patient ID: Adam Daniels, male    DOB: Jul 19, 1956, 64 y.o.   MRN: [id number]  HPI: Adam Daniels is a 64 y.o. male presenting on 04/27/2020 for Annual Exam (Pt stated--lisinopril causes leg to feels funny. Denied pain.)   See prior visit - we started atorvastatin for the past 2 months. He notes some pressure sensation to legs mainly when laying down. He has also changed diet - backed off bacon, increased cheerios.   [place] large portion of the [place] [facility])  Preventative: COLONOSCOPY Date:02/2017 [name]) severe diverticulosis, rpt 10 yrs Prostate cancer screening -he does havefmhx prostate cancer(father) so we have recommended yearly screening. No nocturia, has strong stream.  Fluyearly  Td - 2007, Tdap 2019 COVID vaccine - [place] 11/2019, 12/2019 Shingrix - discussed  Seat belt use discussed  Sunscreen use discussed. Denies changing moles. Ex smoker - quit remotely(1990)  Alcohol - cutting down - 3-4 beers per week Dentist - Q6 mo  Eye exam - 2019 New hearing aides.   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 Interim medical history since our last visit reviewed. Allergies and medications reviewed and updated. Outpatient Medications Prior to Visit  Medication Sig Dispense Refill  . atorvastatin (LIPITOR) 20 MG tablet Take 1 tablet (20 mg total) by mouth daily. 90 tablet 3  . etodolac (LODINE) 500 MG tablet Take 1 mg by mouth 2 (two) times daily. As needed (Patient not taking: Reported on 04/27/2020)     No  facility-administered medications prior to visit.     Per HPI unless specifically indicated in ROS section below Review of Systems  Constitutional: Negative for activity change, appetite change, chills, fatigue, fever and unexpected weight change.  HENT: Negative for hearing loss.   Eyes: Negative for visual disturbance.  Respiratory: Negative for cough, chest tightness, shortness of breath and wheezing.   Cardiovascular: Negative for chest pain, palpitations and leg swelling.  Gastrointestinal: Negative for abdominal distention, abdominal pain, blood in stool, constipation, diarrhea, nausea and vomiting.  Genitourinary: Negative for difficulty urinating and hematuria.  Musculoskeletal: Negative for arthralgias, myalgias and neck pain.  Skin: Negative for rash.  Neurological: Negative for dizziness, seizures, syncope and headaches.  Hematological: Negative for adenopathy. Does not bruise/bleed easily.  Psychiatric/Behavioral: Negative for dysphoric mood. The patient is not nervous/anxious.    Objective:  BP 128/86   Pulse 78   Temp 97.9 F (36.6 C)   Ht 5\' 8"  (1.727 m)   Wt 195 lb (88.5 kg)   SpO2 98%   BMI 29.65 kg/m   Wt Readings from Last 3 Encounters:  04/27/20 195 lb (88.5 kg)  02/23/20 191 lb 8 oz (86.9 kg)  04/23/19 184 lb (83.5 kg)      Physical Exam Vitals and nursing note reviewed.  Constitutional:      General: He is not in acute distress.    Appearance: Normal appearance. He is well-developed. He is not ill-appearing.  HENT:  Head: Normocephalic and atraumatic.     Right Ear: Hearing, tympanic membrane, ear canal and external ear normal.     Left Ear: Hearing, tympanic membrane, ear canal and external ear normal.     Nose: Nose normal.  Eyes:     General: No scleral icterus.    Extraocular Movements: Extraocular movements intact.     Conjunctiva/sclera: Conjunctivae normal.     Pupils: Pupils are equal, round, and reactive to light.  Neck:     Thyroid:  No thyroid mass, thyromegaly or thyroid tenderness.     Vascular: No carotid bruit.  Cardiovascular:     Rate and Rhythm: Normal rate and regular rhythm.     Pulses: Normal pulses.          Radial pulses are 2+ on the right side and 2+ on the left side.     Heart sounds: Normal heart sounds. No murmur heard.   Pulmonary:     Effort: Pulmonary effort is normal. No respiratory distress.     Breath sounds: Normal breath sounds. No wheezing, rhonchi or rales.  Abdominal:     General: Abdomen is flat. Bowel sounds are normal. There is no distension.     Palpations: Abdomen is soft. There is no mass.     Tenderness: There is no abdominal tenderness. There is no guarding or rebound.     Hernia: No hernia is present.  Musculoskeletal:        General: Normal range of motion.     Cervical back: Normal range of motion and neck supple.     Right lower leg: No edema.     Left lower leg: No edema.  Lymphadenopathy:     Cervical: No cervical adenopathy.  Skin:    General: Skin is warm and dry.     Findings: No rash.  Neurological:     General: No focal deficit present.     Mental Status: He is alert and oriented to person, place, and time.     Comments: CN grossly intact, station and gait intact  Psychiatric:        Mood and Affect: Mood normal.        Behavior: Behavior normal.        Thought Content: Thought content normal.        Judgment: Judgment normal.       Results for orders placed or performed in visit on 04/20/20  Hepatic function panel  Result Value Ref Range   Total Bilirubin 0.9 0.2 - 1.2 mg/dL   Bilirubin, Direct 0.1 0.0 - 0.3 mg/dL   Alkaline Phosphatase 111 39 - 117 U/L   AST 32 0 - 37 U/L   ALT 43 0 - 53 U/L   Total Protein 7.1 6.0 - 8.3 g/dL   Albumin 4.2 3.5 - 5.2 g/dL  Lipid panel  Result Value Ref Range   Cholesterol 148 0 - 200 mg/dL   Triglycerides 109.0 0 - 149 mg/dL   HDL 52.20 >39.00 mg/dL   VLDL 21.8 0.0 - 40.0 mg/dL   LDL Cholesterol 74 0 - 99 mg/dL    Total CHOL/HDL Ratio 3    NonHDL 95.79    Lab Results  Component Value Date   PSA 1.45 02/18/2020   PSA 1.63 04/19/2019   PSA 1.70 04/08/2018   Assessment & Plan:  This visit occurred during the SARS-CoV-2 public health emergency.  Safety protocols were in place, including screening questions prior to the visit, additional usage of staff  PPE, and extensive cleaning of exam room while observing appropriate contact time as indicated for disinfecting solutions.   Problem List Items Addressed This Visit    Overweight    Discussed weight goal of 180-185lbs to start.       HYPERCHOLESTEROLEMIA    Marked improvement with atorvastatin. Continue. He is hopeful to lose weight and complete healthy diet/lifestyle changes where he won't need statin. He does have significant fmhx CAD and stroke however.  The 10-year ASCVD risk score [name], et al., 2013) is: 9.2%   Values used to calculate the score:     Age: 38 years     Sex: Male     Is Non-Hispanic African American: No     Diabetic: No     Tobacco smoker: No     Systolic Blood Pressure: 563 mmHg     Is BP treated: No     HDL Cholesterol: 52.2 mg/dL     Total Cholesterol: 148 mg/dL       Healthcare maintenance - Primary    Preventative protocols reviewed and updated unless pt declined. Discussed healthy diet and lifestyle.       Alcohol use    Has significantly cut down.           Meds ordered this encounter  Medications  . co-enzyme Q-10 50 MG capsule    Sig: Take 1 capsule (50 mg total) by mouth 2 (two) times daily.   No orders of the defined types were placed in this encounter.   Patient instructions: Cholesterol levels were so much better! Continue atorvastatin (lipitor). Work towards weight loss - goal 180-185 lbs.  Consider coenzyme Q10 supplement at 50mg  1-2 a day as muscle supplement which may help leg symptom if they're coming from atorvastatin (lipitor).  Consider shingles vaccine 2 shot series (shingrix)  - let us know if interested.  You are doing well today Return as needed or in 1 year for next physical. Call us when you get medicare to schedule a Welcome to Medicare visit.   Follow up plan: Return in about 1 year (around 04/27/2021) for annual exam, prior fasting for blood work.  [name], MD

## 2020-04-27 NOTE — Assessment & Plan Note (Signed)
Has significantly cut down.  ?

## 2020-05-29 HISTORY — PX: CARDIAC CATHETERIZATION: SHX172

## 2020-06-29 ENCOUNTER — Telehealth: Payer: Self-pay | Admitting: Family Medicine

## 2020-06-29 NOTE — Telephone Encounter (Signed)
Patient called in stating he is wanting to have his shingles vaccine. Please advise if this is ok to schedule.

## 2020-06-29 NOTE — Telephone Encounter (Signed)
Ok to schedule. Thanks

## 2020-07-21 ENCOUNTER — Telehealth: Payer: Self-pay

## 2020-07-21 DIAGNOSIS — I252 Old myocardial infarction: Secondary | ICD-10-CM

## 2020-07-21 DIAGNOSIS — I2510 Atherosclerotic heart disease of native coronary artery without angina pectoris: Secondary | ICD-10-CM | POA: Insufficient documentation

## 2020-07-21 NOTE — Telephone Encounter (Signed)
Pt said he got a call from [facility] that atorvastatin 20 mg ready for pick up. Pt said he had a heart attack 05/26/20 at [facility] and pt saw card, Dr Erenest Rasher who changed pt from atorvastatin 20 mg to 40 mg daily. Pt request atorvastatin 20 mg d/c at [place]. I called [facility] [address] and spoke with  Levada Dy; and cancelled available refills of atorvastatin 20 mg. Levada Dy said pt had 4 yrs of refills for atorvastatin 40 mg [facility]. Pt said he is going to continue to see DR G for physicals but will also continue seeing Dr Cindee Lame as his cardiologist.  Juluis Rainier to Dr Darnell Level. Pt last seen 04/27/20 for annual exam.

## 2020-07-21 NOTE — Telephone Encounter (Signed)
Noted  

## 2020-07-25 ENCOUNTER — Ambulatory Visit (INDEPENDENT_AMBULATORY_CARE_PROVIDER_SITE_OTHER): Payer: BC Managed Care – PPO

## 2020-07-25 DIAGNOSIS — Z23 Encounter for immunization: Secondary | ICD-10-CM | POA: Diagnosis not present

## 2020-07-25 NOTE — Progress Notes (Signed)
Shingrix vaccine given.  Patient tolerated well.

## 2020-07-26 ENCOUNTER — Telehealth: Payer: Self-pay | Admitting: *Deleted

## 2020-07-26 NOTE — Telephone Encounter (Signed)
FYI to [place]. Patient left a voicemail stating that he had a shingles vaccine yesterday. Patient stated that he was talking to a friend today and was asked when he was scheduled for his second vaccine. Patient stated that he was not advised that he had to get two. Patient requested a call back.  Call patient back and was advised that he was not happy because he was not told that he had to get two shingles vaccines. Patient stated that he was not given any paperwork on the vaccine when he got it yesterday. Patient was advised that this vaccine is a two vaccine shot given and the time frame that he needs to get the second one. Patient stated that he hopes the second one does not hurt and make his arm as sore as this one had. Patient stated that his arm is sore but he can deal with it. Patient stated that he will call back and schedule his second vaccine within the time frame that he needs to get it.

## 2020-08-15 NOTE — Telephone Encounter (Signed)
Noted, reminders sent out to staff of how to properly educate patients when scheduling and receiving the shingrix injection.

## 2021-02-15 ENCOUNTER — Other Ambulatory Visit: Payer: Self-pay

## 2021-02-15 ENCOUNTER — Inpatient Hospital Stay (HOSPITAL_COMMUNITY)
Admission: EM | Admit: 2021-02-15 | Discharge: 2021-02-20 | DRG: 543 | Disposition: A | Discharge status: Home Health | Payer: Medicare PPO | Attending: Internal Medicine | Admitting: Internal Medicine

## 2021-02-15 ENCOUNTER — Encounter (HOSPITAL_COMMUNITY): Payer: Self-pay | Admitting: Radiology

## 2021-02-15 ENCOUNTER — Emergency Department (HOSPITAL_COMMUNITY): Payer: Medicare PPO

## 2021-02-15 DIAGNOSIS — T402X5A Adverse effect of other opioids, initial encounter: Secondary | ICD-10-CM | POA: Diagnosis present

## 2021-02-15 DIAGNOSIS — W19XXXA Unspecified fall, initial encounter: Secondary | ICD-10-CM

## 2021-02-15 DIAGNOSIS — Z823 Family history of stroke: Secondary | ICD-10-CM

## 2021-02-15 DIAGNOSIS — Z888 Allergy status to other drugs, medicaments and biological substances status: Secondary | ICD-10-CM | POA: Diagnosis not present

## 2021-02-15 DIAGNOSIS — R933 Abnormal findings on diagnostic imaging of other parts of digestive tract: Secondary | ICD-10-CM | POA: Diagnosis not present

## 2021-02-15 DIAGNOSIS — M4856XA Collapsed vertebra, not elsewhere classified, lumbar region, initial encounter for fracture: Secondary | ICD-10-CM | POA: Diagnosis present

## 2021-02-15 DIAGNOSIS — Z885 Allergy status to narcotic agent status: Secondary | ICD-10-CM

## 2021-02-15 DIAGNOSIS — R112 Nausea with vomiting, unspecified: Secondary | ICD-10-CM

## 2021-02-15 DIAGNOSIS — Z8052 Family history of malignant neoplasm of bladder: Secondary | ICD-10-CM | POA: Diagnosis not present

## 2021-02-15 DIAGNOSIS — Z8249 Family history of ischemic heart disease and other diseases of the circulatory system: Secondary | ICD-10-CM | POA: Diagnosis not present

## 2021-02-15 DIAGNOSIS — Z833 Family history of diabetes mellitus: Secondary | ICD-10-CM | POA: Diagnosis not present

## 2021-02-15 DIAGNOSIS — Z7902 Long term (current) use of antithrombotics/antiplatelets: Secondary | ICD-10-CM

## 2021-02-15 DIAGNOSIS — I252 Old myocardial infarction: Secondary | ICD-10-CM | POA: Diagnosis not present

## 2021-02-15 DIAGNOSIS — G8929 Other chronic pain: Secondary | ICD-10-CM | POA: Diagnosis present

## 2021-02-15 DIAGNOSIS — W11XXXA Fall on and from ladder, initial encounter: Secondary | ICD-10-CM | POA: Diagnosis present

## 2021-02-15 DIAGNOSIS — K567 Ileus, unspecified: Secondary | ICD-10-CM

## 2021-02-15 DIAGNOSIS — Z8719 Personal history of other diseases of the digestive system: Secondary | ICD-10-CM | POA: Diagnosis not present

## 2021-02-15 DIAGNOSIS — S335XXA Sprain of ligaments of lumbar spine, initial encounter: Secondary | ICD-10-CM | POA: Diagnosis present

## 2021-02-15 DIAGNOSIS — Y92009 Unspecified place in unspecified non-institutional (private) residence as the place of occurrence of the external cause: Secondary | ICD-10-CM | POA: Diagnosis not present

## 2021-02-15 DIAGNOSIS — Z79899 Other long term (current) drug therapy: Secondary | ICD-10-CM

## 2021-02-15 DIAGNOSIS — K5903 Drug induced constipation: Secondary | ICD-10-CM | POA: Diagnosis present

## 2021-02-15 DIAGNOSIS — Z20822 Contact with and (suspected) exposure to covid-19: Secondary | ICD-10-CM | POA: Diagnosis present

## 2021-02-15 DIAGNOSIS — Z87891 Personal history of nicotine dependence: Secondary | ICD-10-CM

## 2021-02-15 DIAGNOSIS — K5730 Diverticulosis of large intestine without perforation or abscess without bleeding: Secondary | ICD-10-CM | POA: Diagnosis present

## 2021-02-15 DIAGNOSIS — I2510 Atherosclerotic heart disease of native coronary artery without angina pectoris: Secondary | ICD-10-CM | POA: Diagnosis present

## 2021-02-15 DIAGNOSIS — E7800 Pure hypercholesterolemia, unspecified: Secondary | ICD-10-CM | POA: Diagnosis present

## 2021-02-15 DIAGNOSIS — Z83438 Family history of other disorder of lipoprotein metabolism and other lipidemia: Secondary | ICD-10-CM | POA: Diagnosis not present

## 2021-02-15 DIAGNOSIS — S32019A Unspecified fracture of first lumbar vertebra, initial encounter for closed fracture: Secondary | ICD-10-CM | POA: Insufficient documentation

## 2021-02-15 DIAGNOSIS — S32001A Stable burst fracture of unspecified lumbar vertebra, initial encounter for closed fracture: Secondary | ICD-10-CM | POA: Insufficient documentation

## 2021-02-15 DIAGNOSIS — Z951 Presence of aortocoronary bypass graft: Secondary | ICD-10-CM

## 2021-02-15 DIAGNOSIS — S32010A Wedge compression fracture of first lumbar vertebra, initial encounter for closed fracture: Secondary | ICD-10-CM | POA: Diagnosis present

## 2021-02-15 DIAGNOSIS — K560 Paralytic ileus: Secondary | ICD-10-CM | POA: Diagnosis present

## 2021-02-15 LAB — CBC WITH DIFFERENTIAL/PLATELET
Abs Immature Granulocytes: 0.06 10*3/uL (ref 0.00–0.07)
Basophils Absolute: 0 10*3/uL (ref 0.0–0.1)
Basophils Relative: 0 %
Eosinophils Absolute: 0.2 10*3/uL (ref 0.0–0.5)
Eosinophils Relative: 1 %
HCT: 47.2 % (ref 39.0–52.0)
Hemoglobin: 15.6 g/dL (ref 13.0–17.0)
Immature Granulocytes: 1 %
Lymphocytes Relative: 8 %
Lymphs Abs: 0.9 10*3/uL (ref 0.7–4.0)
MCH: 30.4 pg (ref 26.0–34.0)
MCHC: 33.1 g/dL (ref 30.0–36.0)
MCV: 92 fL (ref 80.0–100.0)
Monocytes Absolute: 0.7 10*3/uL (ref 0.1–1.0)
Monocytes Relative: 5 %
Neutro Abs: 10.7 10*3/uL — ABNORMAL HIGH (ref 1.7–7.7)
Neutrophils Relative %: 85 %
Platelets: 255 10*3/uL (ref 150–400)
RBC: 5.13 MIL/uL (ref 4.22–5.81)
RDW: 13.2 % (ref 11.5–15.5)
WBC: 12.6 10*3/uL — ABNORMAL HIGH (ref 4.0–10.5)
nRBC: 0 % (ref 0.0–0.2)

## 2021-02-15 LAB — COMPREHENSIVE METABOLIC PANEL
ALT: 28 U/L (ref 0–44)
AST: 28 U/L (ref 15–41)
Albumin: 3.8 g/dL (ref 3.5–5.0)
Alkaline Phosphatase: 81 U/L (ref 38–126)
Anion gap: 7 (ref 5–15)
BUN: 16 mg/dL (ref 8–23)
CO2: 26 mmol/L (ref 22–32)
Calcium: 8.9 mg/dL (ref 8.9–10.3)
Chloride: 105 mmol/L (ref 98–111)
Creatinine, Ser: 0.77 mg/dL (ref 0.61–1.24)
GFR, Estimated: >60 mL/min (ref >60)
Glucose, Bld: 111 mg/dL — ABNORMAL HIGH (ref 70–99)
Potassium: 4 mmol/L (ref 3.5–5.1)
Sodium: 138 mmol/L (ref 135–145)
Total Bilirubin: 0.9 mg/dL (ref 0.3–1.2)
Total Protein: 6.4 g/dL — ABNORMAL LOW (ref 6.5–8.1)

## 2021-02-15 LAB — SARS CORONAVIRUS 2 (TAT 6-24 HRS): SARS Coronavirus 2: NEGATIVE

## 2021-02-15 LAB — URINALYSIS, ROUTINE W REFLEX MICROSCOPIC
Bilirubin Urine: NEGATIVE
Glucose, UA: NEGATIVE mg/dL
Hgb urine dipstick: NEGATIVE
Ketones, ur: 5 mg/dL — AB
Leukocytes,Ua: NEGATIVE
Nitrite: NEGATIVE
Protein, ur: NEGATIVE mg/dL
Specific Gravity, Urine: 1.012 (ref 1.005–1.030)
pH: 8 (ref 5.0–8.0)

## 2021-02-15 LAB — I-STAT CHEM 8, ED
BUN: 18 mg/dL (ref 8–23)
Calcium, Ion: 1.18 mmol/L (ref 1.15–1.40)
Chloride: 105 mmol/L (ref 98–111)
Creatinine, Ser: 0.6 mg/dL — ABNORMAL LOW (ref 0.61–1.24)
Glucose, Bld: 102 mg/dL — ABNORMAL HIGH (ref 70–99)
HCT: 45 % (ref 39.0–52.0)
Hemoglobin: 15.3 g/dL (ref 13.0–17.0)
Potassium: 4 mmol/L (ref 3.5–5.1)
Sodium: 139 mmol/L (ref 135–145)
TCO2: 26 mmol/L (ref 22–32)

## 2021-02-15 LAB — HIV ANTIBODY (ROUTINE TESTING W REFLEX): HIV Screen 4th Generation wRfx: NONREACTIVE

## 2021-02-15 MED ORDER — ATORVASTATIN CALCIUM 10 MG PO TABS
20.0000 mg | ORAL_TABLET | Freq: Every day | ORAL | Status: DC
  Administered 2021-02-16 – 2021-02-20 (×5): 20 mg via ORAL
  Filled 2021-02-15 (×5): qty 2

## 2021-02-15 MED ORDER — ACETAMINOPHEN 650 MG RE SUPP: 650.0000 mg | Freq: Four times a day (QID) | RECTAL | Status: DC | PRN

## 2021-02-15 MED ORDER — ACETAMINOPHEN 325 MG PO TABS: 650.0000 mg | ORAL_TABLET | Freq: Four times a day (QID) | ORAL | Status: DC | PRN

## 2021-02-15 MED ORDER — HYDROMORPHONE HCL 1 MG/ML IJ SOLN
0.5000 mg | INTRAMUSCULAR | Status: DC | PRN
  Administered 2021-02-15 – 2021-02-16 (×3): 1 mg via INTRAVENOUS
  Filled 2021-02-15 (×3): qty 1

## 2021-02-15 MED ORDER — ONDANSETRON HCL 4 MG/2ML IJ SOLN
4.0000 mg | Freq: Four times a day (QID) | INTRAMUSCULAR | Status: DC | PRN
  Administered 2021-02-17: 4 mg via INTRAVENOUS
  Filled 2021-02-15: qty 2

## 2021-02-15 MED ORDER — POLYETHYLENE GLYCOL 3350 17 G PO PACK: 17.0000 g | PACK | Freq: Every day | ORAL | Status: DC | PRN

## 2021-02-15 MED ORDER — IOHEXOL 300 MG/ML  SOLN
100.0000 mL | Freq: Once | INTRAMUSCULAR | Status: AC | PRN
  Administered 2021-02-15: 100 mL via INTRAVENOUS

## 2021-02-15 MED ORDER — ASPIRIN EC 81 MG PO TBEC
81.0000 mg | DELAYED_RELEASE_TABLET | Freq: Every day | ORAL | Status: DC
  Administered 2021-02-16 – 2021-02-20 (×5): 81 mg via ORAL
  Filled 2021-02-15 (×5): qty 1

## 2021-02-15 MED ORDER — FENTANYL CITRATE (PF) 100 MCG/2ML IJ SOLN
50.0000 ug | INTRAMUSCULAR | Status: DC | PRN
  Administered 2021-02-15 (×2): 50 ug via INTRAVENOUS
  Filled 2021-02-15 (×2): qty 2

## 2021-02-15 MED ORDER — HYDROMORPHONE HCL 1 MG/ML IJ SOLN
1.0000 mg | Freq: Once | INTRAMUSCULAR | Status: AC
  Administered 2021-02-15: 1 mg via INTRAVENOUS
  Filled 2021-02-15: qty 1

## 2021-02-15 MED ORDER — ONDANSETRON HCL 4 MG PO TABS: 4.0000 mg | ORAL_TABLET | Freq: Four times a day (QID) | ORAL | Status: DC | PRN

## 2021-02-15 MED ORDER — CLOPIDOGREL BISULFATE 75 MG PO TABS
75.0000 mg | ORAL_TABLET | Freq: Every day | ORAL | Status: DC
  Administered 2021-02-16 – 2021-02-20 (×5): 75 mg via ORAL
  Filled 2021-02-15 (×5): qty 1

## 2021-02-15 MED ORDER — CYCLOBENZAPRINE HCL 10 MG PO TABS
10.0000 mg | ORAL_TABLET | Freq: Three times a day (TID) | ORAL | Status: DC | PRN
  Administered 2021-02-15 – 2021-02-19 (×7): 10 mg via ORAL
  Filled 2021-02-15 (×7): qty 1

## 2021-02-15 MED ORDER — HAIR/SKIN/NAILS PO CAPS: 1.0000 | ORAL_CAPSULE | Freq: Every day | ORAL | Status: DC
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
## 2021-02-15 NOTE — ED Notes (Signed)
Bruising noted on right flank

## 2021-02-15 NOTE — ED Provider Notes (Signed)
  Physical Exam  BP 123/82 (BP Location: Left Arm)   Pulse 80   Temp 97.8 F (36.6 C) (Temporal)   Resp 20   Ht 5\' 7"  (1.702 m)   Wt 81.6 kg   SpO2 98%   BMI 28.19 kg/m   Physical Exam  ED Course/Procedures     Procedures  MDM  Patient with fall.  Low back pain.  Found on CT scanning to have an L1 compression fracture.  About 45% compression and about 4 mm retropulsion.  Not weak but severe pain with movement.  Unable to ambulate this time.  Discussed with neurosurgery.  Discussed with Glenford Peers.  May use TLSO.  Will admit to hospital for pain control.  Follow-up as an outpatient.  If new symptoms develop potentially would require MRI but not needed at this time.       Davonna Belling, MD 02/15/21 [phone]

## 2021-02-15 NOTE — ED Notes (Signed)
Attempted to call report to floor x1. 
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
## 2021-02-15 NOTE — ED Triage Notes (Signed)
Pt arrives via EMS from home. Pt on 16 ft ladder. 3 steps from top when a branch came and knocked him down, fell to concrete. No LOC. Pt crawled to bed took a flexeril and released he could not move, called 911  EMS gave 1108mcg total fentenyl 122/78 HR 78 CBG 140 20g RAC

## 2021-02-15 NOTE — ED Notes (Signed)
Pt unable to tolerate walking d/t pain

## 2021-02-15 NOTE — ED Notes (Signed)
Attempted to call report to floor x2.

## 2021-02-16 DIAGNOSIS — I2510 Atherosclerotic heart disease of native coronary artery without angina pectoris: Secondary | ICD-10-CM

## 2021-02-16 DIAGNOSIS — S32010A Wedge compression fracture of first lumbar vertebra, initial encounter for closed fracture: Secondary | ICD-10-CM

## 2021-02-16 DIAGNOSIS — W19XXXA Unspecified fall, initial encounter: Secondary | ICD-10-CM

## 2021-02-16 LAB — CBC
HCT: 44.9 % (ref 39.0–52.0)
Hemoglobin: 15.2 g/dL (ref 13.0–17.0)
MCH: 30.6 pg (ref 26.0–34.0)
MCHC: 33.9 g/dL (ref 30.0–36.0)
MCV: 90.5 fL (ref 80.0–100.0)
Platelets: 266 10*3/uL (ref 150–400)
RBC: 4.96 MIL/uL (ref 4.22–5.81)
RDW: 13.4 % (ref 11.5–15.5)
WBC: 12.2 10*3/uL — ABNORMAL HIGH (ref 4.0–10.5)
nRBC: 0 % (ref 0.0–0.2)

## 2021-02-16 MED ORDER — HYDROCODONE-ACETAMINOPHEN 5-325 MG PO TABS
1.0000 | ORAL_TABLET | ORAL | Status: DC | PRN
  Administered 2021-02-16 – 2021-02-18 (×8): 1 via ORAL
  Filled 2021-02-16 (×8): qty 1

## 2021-02-16 MED ORDER — SENNOSIDES-DOCUSATE SODIUM 8.6-50 MG PO TABS
1.0000 | ORAL_TABLET | Freq: Two times a day (BID) | ORAL | Status: DC
  Administered 2021-02-16 – 2021-02-20 (×8): 1 via ORAL
  Filled 2021-02-16 (×9): qty 1

## 2021-02-16 MED ORDER — HYDROCODONE-ACETAMINOPHEN 5-325 MG PO TABS: 1.0000 | ORAL_TABLET | ORAL | Status: DC | PRN

## 2021-02-16 MED ORDER — LIDOCAINE 5 % EX PTCH
1.0000 | MEDICATED_PATCH | CUTANEOUS | Status: DC
  Administered 2021-02-16 – 2021-02-20 (×5): 1 via TRANSDERMAL
  Filled 2021-02-16 (×5): qty 1

## 2021-02-16 NOTE — Progress Notes (Signed)
Orthopedic Tech Progress Note Patient Details:  Adam Daniels 27-Apr-1956 [id number]  Ortho Devices Ortho Device/Splint Location: TLSO Ortho Device/Splint Interventions: Gerlean Ren A Spenser Cong 02/16/2021, 1:47 PM

## 2021-02-16 NOTE — Progress Notes (Signed)
[facility]                                                                              Patient Demographics  Adam Daniels, is a 65 y.o. male, DOB - 13-Aug-1956, PJK:[id number]  Admit date - 02/15/2021   Admitting Physician Lequita Halt, MD  Outpatient Primary MD for the patient is [name], MD  Outpatient specialists:   LOS - 1  days   Medical records reviewed and are as summarized below:    Chief Complaint  Patient presents with  . Fall       Brief summary   Patient is a 65 year old male with history of CAD, MI status post CABG in 2021, hyperlipidemia, chronic back pain presented after a fall from the ladder at home.  Patient reported that he was climbing up a ladder to cut the tree branches when he fell down on concrete.  He fell on his lower back, denied hitting his head.  Since then patient was having low back pain and pain on his left side, sharp and stabbing, nonradiating. CT concerning for L1 compression fracture.  Due to uncontrolled pain, patient was admitted for further work-up, PT OT.  Assessment & Plan    Principal Problem: Acute compression fracture of L1 lumbar vertebra (HCC) -CT L-spine showed acute L1 superior endplate compression fracture with approximately 45% height loss anteriorly.  4 mm retropulsion of the posterior superior endplate.  No additional fractures.  Multilevel disc bulging. -Still in significant pain, not much when he is laying still, has not ambulated yet.  -Continue Dilaudid IV, added Norco as needed for moderate or breakthrough pain.  Added Lidoderm patch -PT OT evaluation, TLSO brace -IR evaluation for kyphoplasty if candidate.  Patient was evaluated by IR, at this time, patient and family requested conservative measures. He is currently on Plavix due to history of CAD in 2021, which will need to be held for 5 days prior to the procedure, can be done outpatient per IR if conservative treatment fails.  Active  Problems:   HYPERCHOLESTEROLEMIA -Continue Lipitor    CAD (coronary artery disease) -Continue aspirin, Plavix, Lipitor -No acute chest pain or shortness of breath.   Code Status: Full CODE STATUS DVT Prophylaxis:  SCDs Start: 02/15/21 1710   Level of Care: Level of care: Med-Surg Family Communication: Discussed all imaging results, lab results, explained to the patient and wife   Disposition Plan:     Status is: Inpatient  Remains inpatient appropriate because:Inpatient level of care appropriate due to severity of illness   Dispo: The patient is from: Home              Anticipated d/c is to: Home              Patient currently is not medically stable to d/c.  Still in significant pain, PT evaluation pending   Difficult to place patient No      Time Spent in minutes 25 minutes  Procedures:  CT L-spine  Consultants:   IR  Antimicrobials:   Anti-infectives (From admission, onward)   None  Medications  Scheduled Meds: . aspirin EC  81 mg Oral Daily  . atorvastatin  20 mg Oral Daily  . clopidogrel  75 mg Oral Daily  . lidocaine  1 patch Transdermal Q24H  . senna-docusate  1 tablet Oral BID   Continuous Infusions: PRN Meds:.acetaminophen **OR** acetaminophen, cyclobenzaprine, HYDROcodone-acetaminophen, HYDROmorphone (DILAUDID) injection, ondansetron **OR** ondansetron (ZOFRAN) IV, polyethylene glycol      Subjective:   Adam Daniels was seen and examined today.  Pain controlled when laying still, has not ambulated yet at the time of my encounter.  No lower extremity pain, numbness tingling or weakness.  Patient denies dizziness, chest pain, shortness of breath, abdominal pain, N/V/D/C. No acute events overnight.    Objective:   Vitals:   02/15/21 2136 02/16/21 0040 02/16/21 0700 02/16/21 0758  BP: 118/77 123/85 110/77 118/87  Pulse: 87 90 86 77  Resp: 14 16 16 17   Temp: 98.3 F (36.8 C) 98.7 F (37.1 C) 98.2 F (36.8 C) 98 F (36.7 C)   TempSrc: Oral Oral Oral Oral  SpO2: 90% 93% 93% 93%  Weight:      Height:        Intake/Output Summary (Last 24 hours) at 02/16/2021 1127 Last data filed at 02/16/2021 0700 Gross per 24 hour  Intake --  Output 725 ml  Net -725 ml     Wt Readings from Last 3 Encounters:  02/15/21 81.6 kg  04/27/20 88.5 kg  02/23/20 86.9 kg     Exam  General: Alert and oriented x 3, NAD  Cardiovascular: S1 S2 auscultated, no murmurs, RRR  Respiratory: CTA B  Gastrointestinal: Soft, nontender, nondistended, + bowel sounds  Ext: no pedal edema bilaterally  Neuro: strength 5/5 UEs, LE BL  Skin: No rashes  Psych: Normal affect and demeanor, alert and oriented x3    Data Reviewed:  I have personally reviewed following labs and imaging studies  Micro Results Recent Results (from the past 240 hour(s))  SARS CORONAVIRUS 2 (TAT 6-24 HRS) Nasopharyngeal Nasopharyngeal Swab     Status: None   Collection Time: 02/15/21  5:41 PM   Specimen: Nasopharyngeal Swab  Result Value Ref Range Status   SARS Coronavirus 2 NEGATIVE NEGATIVE Final    Comment: (NOTE) SARS-CoV-2 target nucleic acids are NOT DETECTED.  The SARS-CoV-2 RNA is generally detectable in upper and lower respiratory specimens during the acute phase of infection. Negative results do not preclude SARS-CoV-2 infection, do not rule out co-infections with other pathogens, and should not be used as the sole basis for treatment or other patient management decisions. Negative results must be combined with clinical observations, patient history, and epidemiological information. The expected result is Negative.  Fact Sheet for Patients: SugarRoll.be  Fact Sheet for Healthcare Providers: https://www.woods-mathews.com/  This test is not yet approved or cleared by the [place] FDA and  has been authorized for detection and/or diagnosis of SARS-CoV-2 by FDA under an Emergency Use  Authorization (EUA). This EUA will remain  in effect (meaning this test can be used) for the duration of the COVID-19 declaration under Se ction 564(b)(1) of the Act, 21 U.S.C. section 360bbb-3(b)(1), unless the authorization is terminated or revoked sooner.  Performed at [facility], [place] [address], [place], [place] [address]     Radiology Reports CT ABDOMEN PELVIS W CONTRAST  Result Date: 02/15/2021 CLINICAL DATA:  Severe low back pain after being knocked off a ladder by a tree branch. EXAM: CT ABDOMEN AND PELVIS WITH CONTRAST TECHNIQUE:  Multidetector CT imaging of the abdomen and pelvis was performed using the standard protocol following bolus administration of intravenous contrast. CONTRAST:  156mL OMNIPAQUE IOHEXOL 300 MG/ML  SOLN COMPARISON:  None. FINDINGS: Lower chest: No acute abnormality. Bilateral lower lobe dependent subsegmental atelectasis. Hepatobiliary: No focal liver abnormality is seen. No gallstones, gallbladder wall thickening, or biliary dilatation. Pancreas: Unremarkable. No pancreatic ductal dilatation or surrounding inflammatory changes. Spleen: Normal in size without focal abnormality. Adrenals/Urinary Tract: The adrenal glands are unremarkable. Small bilateral renal cysts. No renal calculi or hydronephrosis. The bladder is unremarkable for the degree of distention. Stomach/Bowel: Stomach is within normal limits. Appendix appears normal. No evidence of bowel wall thickening, distention, or inflammatory changes. Mild sigmoid diverticulosis. Vascular/Lymphatic: Aortic atherosclerosis. No enlarged abdominal or pelvic lymph nodes. Reproductive: Mild prostatomegaly. Other: Small bilateral fat containing inguinal hernias. No free fluid or pneumoperitoneum. Musculoskeletal: Acute L1 superior endplate compression fracture with moderate height loss and mild retropulsion. IMPRESSION: 1. Acute L1 superior endplate compression fracture. 2. Aortic Atherosclerosis (ICD10-I70.0).  Electronically Signed   By: Titus Dubin M.D.   On: 02/15/2021 15:11   CT L-SPINE NO CHARGE  Result Date: 02/15/2021 CLINICAL DATA:  Severe lower back pain after being knocked off a ladder by a tree branch. EXAM: CT LUMBAR SPINE WITH CONTRAST TECHNIQUE: Technique: Multiplanar CT images of the lumbar spine were reconstructed from contemporary CT of the Abdomen and Pelvis. CONTRAST:  No additional COMPARISON:  None. FINDINGS: Segmentation: 5 lumbar type vertebrae. Alignment: Mild levocurvature.  Sagittal alignment is maintained. Vertebrae: Acute L1 superior endplate compression fracture with approximately 45% height loss anteriorly. 4 mm retropulsion of the posterosuperior endplate. No extension into the posterior elements. No additional fracture. Paraspinal and other soft tissues: Please see separate CT abdomen and pelvis report from same day. Disc levels: Mild multilevel disc bulging. No high-grade spinal canal or neuroforaminal stenosis. IMPRESSION: 1. Acute L1 superior endplate compression fracture as described above. No extension into the posterior elements. Electronically Signed   By: Titus Dubin M.D.   On: 02/15/2021 14:58   DG Chest Portable 1 View  Result Date: 02/15/2021 CLINICAL DATA:  Pain following fall EXAM: PORTABLE CHEST 1 VIEW COMPARISON:  None. FINDINGS: There is slight left base atelectasis. Lungs elsewhere are clear. Heart is upper normal in size with pulmonary vascularity normal. Patient is status post internal mammary bypass grafting. No adenopathy. No evident pneumothorax. No bone lesions. IMPRESSION: Mild left base atelectasis. Lungs otherwise clear. Heart upper normal in size with postoperative changes. No pneumothorax. Electronically Signed   By: Lowella Grip III M.D.   On: 02/15/2021 13:45    Lab Data:  CBC: Recent Labs  Lab 02/15/21 1317 02/15/21 1349 02/16/21 0145  WBC 12.6*  --  12.2*  NEUTROABS 10.7*  --   --   HGB 15.6 15.3 15.2  HCT 47.2 45.0 44.9  MCV  92.0  --  90.5  PLT 255  --  563   Basic Metabolic Panel: Recent Labs  Lab 02/15/21 1317 02/15/21 1349  NA 138 139  K 4.0 4.0  CL 105 105  CO2 26  --   GLUCOSE 111* 102*  BUN 16 18  CREATININE 0.77 0.60*  CALCIUM 8.9  --    GFR: Estimated Creatinine Clearance: 94.1 mL/min (A) (by C-G formula based on SCr of 0.6 mg/dL (L)). Liver Function Tests: Recent Labs  Lab 02/15/21 1317  AST 28  ALT 28  ALKPHOS 81  BILITOT 0.9  PROT 6.4*  ALBUMIN 3.8  No results for input(s): LIPASE, AMYLASE in the last 168 hours. No results for input(s): AMMONIA in the last 168 hours. Coagulation Profile: No results for input(s): INR, PROTIME in the last 168 hours. Cardiac Enzymes: No results for input(s): CKTOTAL, CKMB, CKMBINDEX, TROPONINI in the last 168 hours. BNP (last 3 results) No results for input(s): PROBNP in the last 8760 hours. HbA1C: No results for input(s): HGBA1C in the last 72 hours. CBG: No results for input(s): GLUCAP in the last 168 hours. Lipid Profile: No results for input(s): CHOL, HDL, LDLCALC, TRIG, CHOLHDL, LDLDIRECT in the last 72 hours. Thyroid Function Tests: No results for input(s): TSH, T4TOTAL, FREET4, T3FREE, THYROIDAB in the last 72 hours. Anemia Panel: No results for input(s): VITAMINB12, FOLATE, FERRITIN, TIBC, IRON, RETICCTPCT in the last 72 hours. Urine analysis:    Component Value Date/Time   COLORURINE YELLOW 02/15/2021 1314   APPEARANCEUR CLEAR 02/15/2021 1314   LABSPEC 1.012 02/15/2021 1314   PHURINE 8.0 02/15/2021 1314   GLUCOSEU NEGATIVE 02/15/2021 1314   HGBUR NEGATIVE 02/15/2021 1314   HGBUR trace-lysed 10/31/2010 0844   BILIRUBINUR NEGATIVE 02/15/2021 1314   BILIRUBINUR Negative 04/05/2016 1119   KETONESUR 5 (A) 02/15/2021 1314   PROTEINUR NEGATIVE 02/15/2021 1314   UROBILINOGEN 0.2 04/05/2016 1119   UROBILINOGEN 0.2 10/31/2010 0844   NITRITE NEGATIVE 02/15/2021 1314   LEUKOCYTESUR NEGATIVE 02/15/2021 1314     Adam Daniels  M.D. [facility] 02/16/2021, 11:27 AM  Available via Epic secure chat 7am-7pm After 7 pm, please refer to night coverage provider listed on amion.

## 2021-02-16 NOTE — Evaluation (Addendum)
Physical Therapy Evaluation Patient Details Name: Adam Daniels MRN: [id number] DOB: 31-Mar-1956 Today's Date: 02/16/2021   History of Present Illness  65 yo male sustained a burst fracture of L1 and was admitted after falling from ladder at home.  Pt is being assessed for vertebroplasty, and has TLSO for support.  PMHx:  CAD, MI, hypercholesterolemia, colon polyps, diverticulosis, CABG  Clinical Impression  Pt is admitted after a fall resulting in L1 fracture, and is demonstrating a level of comfort with mobility once TLSO is located.  Family began to be concerned about the brace fit after pt walked and relayed this concern to MD.  Follow along for further mobility and will anticipate a decision about doing vertebroplasty, although family is not interested in going this route unless he must.  Follow for goals of acute PT.    Follow Up Recommendations Home health PT;Supervision for mobility/OOB    Equipment Recommendations  Rolling walker with 5" wheels  Rolling walker if wife's old walker will not work    Recommendations for Other Services       Precautions / Restrictions Precautions Precautions: Fall;Back Precaution Booklet Issued: Yes (comment) Precaution Comments: reviewed bed mob and transfers Required Braces or Orthoses: Spinal Brace Spinal Brace: Thoracolumbosacral orthotic Restrictions Weight Bearing Restrictions: No      Mobility  Bed Mobility Overal bed mobility: Needs Assistance Bed Mobility: Rolling;Sidelying to Sit Rolling: Min assist Sidelying to sit: Min assist            Transfers Overall transfer level: Needs assistance Equipment used: Rolling walker (2 wheeled);1 person hand held assist Transfers: Sit to/from Stand Sit to Stand: Min assist         General transfer comment: min assist with extra time and reminders to use hands correctly to power up  Ambulation/Gait Ambulation/Gait assistance: Min guard Gait Distance (Feet): 150 Feet Assistive  device: Rolling walker (2 wheeled);1 person hand held assist Gait Pattern/deviations: Step-to pattern;Step-through pattern;Decreased stride length;Wide base of support Gait velocity: reduced Gait velocity interpretation: <1.31 ft/sec, indicative of household ambulator General Gait Details: pt was able to walk with minor cues for direction and safety  Stairs Stairs: Yes Stairs assistance: Min guard Stair Management: One rail Right;Alternating pattern;Step to pattern;Forwards Number of Stairs: 5 General stair comments: good tolerance with alternating pattern up and step to coming down  Wheelchair Mobility    Modified Rankin (Stroke Patients Only)       Balance Overall balance assessment: Needs assistance;History of Falls Sitting-balance support: Feet supported;Single extremity supported Sitting balance-Leahy Scale: Fair     Standing balance support: Bilateral upper extremity supported;During functional activity Standing balance-Leahy Scale: Fair                               Pertinent Vitals/Pain Pain Assessment: Faces Faces Pain Scale: Hurts little more Pain Location: mid low back Pain Descriptors / Indicators: Grimacing;Guarding Pain Intervention(s): Limited activity within patient's tolerance;Monitored during session;Premedicated before session;Repositioned    Home Living Family/patient expects to be discharged to:: Private residence Living Arrangements: Spouse/significant other Available Help at Discharge: Family;Available 24 hours/day Type of Home: House Home Access: Stairs to enter Entrance Stairs-Rails: Can reach both;Left;Right Entrance Stairs-Number of Steps: 3 Home Layout: One level Home Equipment: Walker - 2 wheels Additional Comments: walker belonged to his wife    Prior Function Level of Independence: Independent         Comments: was climbing a ladder when he fell  Hand Dominance   Dominant Hand: Right    Extremity/Trunk  Assessment   Upper Extremity Assessment Upper Extremity Assessment: Overall WFL for tasks assessed    Lower Extremity Assessment Lower Extremity Assessment: Overall WFL for tasks assessed    Cervical / Trunk Assessment Cervical / Trunk Assessment: Other exceptions (L1 burst fracture)  Communication   Communication: No difficulties  Cognition Arousal/Alertness: Awake/alert Behavior During Therapy: WFL for tasks assessed/performed Overall Cognitive Status: Within Functional Limits for tasks assessed                                        General Comments General comments (skin integrity, edema, etc.): pt is up to walk with help and applied TLSO brace with wife and daughter.  Has good understanding of it, family feeling the sternal pad is not located properly.  MD contacted to relay the concerns, but PT feels his sternal location is fine    Exercises     Assessment/Plan    PT Assessment    PT Problem List         PT Treatment Interventions      PT Goals (Current goals can be found in the Care Plan section)  Acute Rehab PT Goals Patient Stated Goal: to feel better and get stronger PT Goal Formulation: With patient/family Time For Goal Achievement: 03/02/21 Potential to Achieve Goals: Good    Frequency  5x per week   Barriers to discharge        Co-evaluation               AM-PAC PT "6 Clicks" Mobility  Outcome Measure Help needed turning from your back to your side while in a flat bed without using bedrails?: A Little Help needed moving from lying on your back to sitting on the side of a flat bed without using bedrails?: A Little Help needed moving to and from a bed to a chair (including a wheelchair)?: A Little Help needed standing up from a chair using your arms (e.g., wheelchair or bedside chair)?: A Little Help needed to walk in hospital room?: A Little Help needed climbing 3-5 steps with a railing? : A Little 6 Click Score: 18    End  of Session Equipment Utilized During Treatment: Back brace;Gait belt Activity Tolerance: Patient tolerated treatment well;Patient limited by fatigue Patient left: with call bell/phone within reach;in chair;with chair alarm set;with family/visitor present Nurse Communication: Mobility status      Time: 7681-1572 PT Time Calculation (min) (ACUTE ONLY): 43 min   Charges:   PT Evaluation $PT Eval Moderate Complexity: 1 Mod PT Treatments $Gait Training: 8-22 mins $Therapeutic Activity: 8-22 mins       Ramond Dial 02/16/2021, 4:46 PM Mee Hives, PT MS Acute Rehab Dept. Number: [place] [facility] [place]

## 2021-02-16 NOTE — TOC CAGE-AID Note (Signed)
Transition of Care [facility]) - CAGE-AID Screening   Patient Details  Name: Adam Daniels MRN: [id number] Date of Birth: [birth year]    Elvina Sidle, RN  Trauma Response Nurse Phone Number: [phone] 02/16/2021, 10:29 AM   CAGE-AID Screening:    Have You Ever Felt You Ought to Cut Down on Your Drinking or Drug Use?: No Have People Annoyed You By [employer] Your Drinking Or Drug Use?: No Have You Felt Bad Or Guilty About Your Drinking Or Drug Use?: No Have You Ever Had a Drink or Used Drugs First Thing In The Morning to Steady Your Nerves or to Get Rid of a Hangover?: No CAGE-AID Score: 0  Substance Abuse Education Offered: No (denies drinking alcohol/drug use)
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
## 2021-02-17 ENCOUNTER — Inpatient Hospital Stay (HOSPITAL_COMMUNITY): Payer: Medicare PPO

## 2021-02-17 DIAGNOSIS — K567 Ileus, unspecified: Secondary | ICD-10-CM

## 2021-02-17 MED ORDER — POLYETHYLENE GLYCOL 3350 17 G PO PACK
17.0000 g | PACK | Freq: Once | ORAL | Status: AC
  Administered 2021-02-17: 17 g via ORAL
  Filled 2021-02-17: qty 1

## 2021-02-17 MED ORDER — BISACODYL 10 MG RE SUPP
10.0000 mg | Freq: Once | RECTAL | Status: AC
  Administered 2021-02-17: 10 mg via RECTAL
  Filled 2021-02-17: qty 1

## 2021-02-17 MED ORDER — METOCLOPRAMIDE HCL 5 MG/ML IJ SOLN
5.0000 mg | Freq: Four times a day (QID) | INTRAMUSCULAR | Status: DC | PRN
  Administered 2021-02-17: 5 mg via INTRAVENOUS
  Filled 2021-02-17: qty 2

## 2021-02-17 MED ORDER — SODIUM CHLORIDE 0.9 % IV SOLN: INTRAVENOUS | Status: DC

## 2021-02-17 MED ORDER — HYDROMORPHONE HCL 1 MG/ML IJ SOLN: 0.5000 mg | INTRAMUSCULAR | Status: DC | PRN

## 2021-02-17 MED ORDER — DOCUSATE SODIUM 100 MG PO CAPS
100.0000 mg | ORAL_CAPSULE | Freq: Every day | ORAL | Status: DC
  Administered 2021-02-17: 100 mg via ORAL
  Filled 2021-02-17: qty 1

## 2021-02-17 MED ORDER — ONDANSETRON HCL 4 MG/2ML IJ SOLN: 4.0000 mg | INTRAMUSCULAR | Status: DC | PRN

## 2021-02-17 NOTE — Progress Notes (Signed)
[facility]                                                                              Patient Demographics  Adam Daniels, is a 65 y.o. male, DOB - 01/12/1956, UXN:[id number]  Admit date - 02/15/2021   Admitting Physician Lequita Halt, MD  Outpatient Primary MD for the patient is [name], MD  Outpatient specialists:   LOS - 2  days   Medical records reviewed and are as summarized below:    Chief Complaint  Patient presents with  . Fall       Brief summary   Patient is a 64 year old male with history of CAD, MI status post CABG in 2021, hyperlipidemia, chronic back pain presented after a fall from the ladder at home.  Patient reported that he was climbing up a ladder to cut the tree branches when he fell down on concrete.  He fell on his lower back, denied hitting his head.  Since then patient was having low back pain and pain on his left side, sharp and stabbing, nonradiating. CT concerning for L1 compression fracture.  Due to uncontrolled pain, patient was admitted for further work-up, PT OT.  Assessment & Plan    Principal Problem: Acute compression fracture of L1 lumbar vertebra (HCC) -CT L-spine showed acute L1 superior endplate compression fracture with approximately 45% height loss anteriorly.  4 mm retropulsion of the posterior superior endplate.  No additional fractures.  Multilevel disc bulging. -IR evaluation done for kyphoplasty can be done outpatient if conservative management fails -Patient worked with PT on 6/3, recommended home health. -Pain currently controlled, continue TLSO brace  Active Problems: Acute nausea and vomiting with ileus -At a time of examination, patient noted to be nauseous and had vomiting. -Stat abdominal x-ray showed new gaseous distention throughout the redundant large bowel and gas with nondilated small bowel compatible with ileus -Decreased opioids, placed on n.p.o. status, IV fluids, antiemetics including  Zofran, Reglan -If no significant improvement and persistent nausea, vomiting, will place NGT to intermittent wall suction    HYPERCHOLESTEROLEMIA -Continue Lipitor    CAD (coronary artery disease) -Continue aspirin, Plavix, Lipitor -Currently no chest pain or shortness of breath.   Code Status: Full CODE STATUS DVT Prophylaxis:  SCDs Start: 02/15/21 1710   Level of Care: Level of care: Med-Surg Family Communication: Discussed all imaging results, lab results, explained to the patient and discussed with patient's wife on the phone   Disposition Plan:     Status is: Inpatient  Remains inpatient appropriate because:Inpatient level of care appropriate due to severity of illness   Dispo: The patient is from: Home              Anticipated d/c is to: Home              Patient currently is not medically stable to d/c.  Nausea and vomiting today with new ileus   Difficult to place patient No      Time Spent in minutes 25 minutes  Procedures:  CT L-spine  Consultants:   IR  Antimicrobials:   Anti-infectives (From admission, onward)  None         Medications  Scheduled Meds: . aspirin EC  81 mg Oral Daily  . atorvastatin  20 mg Oral Daily  . clopidogrel  75 mg Oral Daily  . docusate sodium  100 mg Oral Daily  . lidocaine  1 patch Transdermal Q24H  . senna-docusate  1 tablet Oral BID   Continuous Infusions: . sodium chloride 100 mL/hr at 02/17/21 0907   PRN Meds:.acetaminophen **OR** acetaminophen, cyclobenzaprine, HYDROcodone-acetaminophen, HYDROmorphone (DILAUDID) injection, metoCLOPramide (REGLAN) injection, [DISCONTINUED] ondansetron **OR** ondansetron (ZOFRAN) IV, polyethylene glycol      Subjective:   [name] was seen and examined today.  On examination, patient noted to be having nausea and vomiting.  Mild diffuse tenderness with left lower quadrant pain, bloating.  No chest pain, shortness of breath.  BM 2 days ago.  No fevers  Objective:    Vitals:   02/16/21 0758 02/16/21 1612 02/17/21 0131 02/17/21 0752  BP: 118/87 106/79 119/72 132/89  Pulse: 77 79 82 93  Resp: 17 18 18 18   Temp: 98 F (36.7 C) 98.2 F (36.8 C) 98.5 F (36.9 C) 98 F (36.7 C)  TempSrc: Oral Oral Oral Oral  SpO2: 93% 92% 100% 91%  Weight:      Height:        Intake/Output Summary (Last 24 hours) at 02/17/2021 1223 Last data filed at 02/17/2021 0857 Gross per 24 hour  Intake 240 ml  Output 450 ml  Net -210 ml     Wt Readings from Last 3 Encounters:  02/15/21 81.6 kg  04/27/20 88.5 kg  02/23/20 86.9 kg    Physical Exam  General: Oriented x3 however appears uncomfortable and sick  Cardiovascular: S1 S2 clear, RRR. No pedal edema b/l  Respiratory: CTAB  Gastrointestinal: Mild diffuse TTP, hypoactive bowel sounds  Ext: no pedal edema bilaterally  Neuro: no new deficit  Psych: uncomfortable, somewhat lethargic   Data Reviewed:  I have personally reviewed following labs and imaging studies  Micro Results Recent Results (from the past 240 hour(s))  SARS CORONAVIRUS 2 (TAT 6-24 HRS) Nasopharyngeal Nasopharyngeal Swab     Status: None   Collection Time: 02/15/21  5:41 PM   Specimen: Nasopharyngeal Swab  Result Value Ref Range Status   SARS Coronavirus 2 NEGATIVE NEGATIVE Final    Comment: (NOTE) SARS-CoV-2 target nucleic acids are NOT DETECTED.  The SARS-CoV-2 RNA is generally detectable in upper and lower respiratory specimens during the acute phase of infection. Negative results do not preclude SARS-CoV-2 infection, do not rule out co-infections with other pathogens, and should not be used as the sole basis for treatment or other patient management decisions. Negative results must be combined with clinical observations, patient history, and epidemiological information. The expected result is Negative.  Fact Sheet for Patients: SugarRoll.be  Fact Sheet for Healthcare  Providers: https://www.woods-mathews.com/  This test is not yet approved or cleared by the [place] FDA and  has been authorized for detection and/or diagnosis of SARS-CoV-2 by FDA under an Emergency Use Authorization (EUA). This EUA will remain  in effect (meaning this test can be used) for the duration of the COVID-19 declaration under Se ction 564(b)(1) of the Act, 21 U.S.C. section 360bbb-3(b)(1), unless the authorization is terminated or revoked sooner.  Performed at [facility], [place] [address], [place], [place] [address]     Radiology Reports CT ABDOMEN PELVIS W CONTRAST  Result Date: 02/15/2021 CLINICAL DATA:  Severe low back pain after being  knocked off a ladder by a tree branch. EXAM: CT ABDOMEN AND PELVIS WITH CONTRAST TECHNIQUE: Multidetector CT imaging of the abdomen and pelvis was performed using the standard protocol following bolus administration of intravenous contrast. CONTRAST:  126mL OMNIPAQUE IOHEXOL 300 MG/ML  SOLN COMPARISON:  None. FINDINGS: Lower chest: No acute abnormality. Bilateral lower lobe dependent subsegmental atelectasis. Hepatobiliary: No focal liver abnormality is seen. No gallstones, gallbladder wall thickening, or biliary dilatation. Pancreas: Unremarkable. No pancreatic ductal dilatation or surrounding inflammatory changes. Spleen: Normal in size without focal abnormality. Adrenals/Urinary Tract: The adrenal glands are unremarkable. Small bilateral renal cysts. No renal calculi or hydronephrosis. The bladder is unremarkable for the degree of distention. Stomach/Bowel: Stomach is within normal limits. Appendix appears normal. No evidence of bowel wall thickening, distention, or inflammatory changes. Mild sigmoid diverticulosis. Vascular/Lymphatic: Aortic atherosclerosis. No enlarged abdominal or pelvic lymph nodes. Reproductive: Mild prostatomegaly. Other: Small bilateral fat containing inguinal hernias. No free fluid or pneumoperitoneum.  Musculoskeletal: Acute L1 superior endplate compression fracture with moderate height loss and mild retropulsion. IMPRESSION: 1. Acute L1 superior endplate compression fracture. 2. Aortic Atherosclerosis (ICD10-I70.0). Electronically Signed   By: Titus Dubin M.D.   On: 02/15/2021 15:11   CT L-SPINE NO CHARGE  Result Date: 02/15/2021 CLINICAL DATA:  Severe lower back pain after being knocked off a ladder by a tree branch. EXAM: CT LUMBAR SPINE WITH CONTRAST TECHNIQUE: Technique: Multiplanar CT images of the lumbar spine were reconstructed from contemporary CT of the Abdomen and Pelvis. CONTRAST:  No additional COMPARISON:  None. FINDINGS: Segmentation: 5 lumbar type vertebrae. Alignment: Mild levocurvature.  Sagittal alignment is maintained. Vertebrae: Acute L1 superior endplate compression fracture with approximately 45% height loss anteriorly. 4 mm retropulsion of the posterosuperior endplate. No extension into the posterior elements. No additional fracture. Paraspinal and other soft tissues: Please see separate CT abdomen and pelvis report from same day. Disc levels: Mild multilevel disc bulging. No high-grade spinal canal or neuroforaminal stenosis. IMPRESSION: 1. Acute L1 superior endplate compression fracture as described above. No extension into the posterior elements. Electronically Signed   By: Titus Dubin M.D.   On: 02/15/2021 14:58   DG Chest Portable 1 View  Result Date: 02/15/2021 CLINICAL DATA:  Pain following fall EXAM: PORTABLE CHEST 1 VIEW COMPARISON:  None. FINDINGS: There is slight left base atelectasis. Lungs elsewhere are clear. Heart is upper normal in size with pulmonary vascularity normal. Patient is status post internal mammary bypass grafting. No adenopathy. No evident pneumothorax. No bone lesions. IMPRESSION: Mild left base atelectasis. Lungs otherwise clear. Heart upper normal in size with postoperative changes. No pneumothorax. Electronically Signed   By: Lowella Grip  III M.D.   On: 02/15/2021 13:45   DG Abd Portable 1V  Result Date: 02/17/2021 CLINICAL DATA:  65 year old male with intractable nausea vomiting. Status post fall from ladder. L1 compression fracture. EXAM: PORTABLE ABDOMEN - 1 VIEW COMPARISON:  CT Abdomen and Pelvis 02/15/2021 FINDINGS: Portable AP supine view at 0942 hours. New gas distended large bowel, which is redundant. Gas extends to the rectum. Gas-filled but nondilated small bowel in the abdomen. L1 compression fracture redemonstrated. Stable lung bases. IMPRESSION: 1. New gaseous distension throughout the redundant large bowel, and gas-filled nondilated small bowel, compatible with ileus. 2. L1 compression fracture redemonstrated. Electronically Signed   By: Genevie Ann M.D.   On: 02/17/2021 09:53    Lab Data:  CBC: Recent Labs  Lab 02/15/21 1317 02/15/21 1349 02/16/21 0145  WBC 12.6*  --  12.2*  NEUTROABS 10.7*  --   --   HGB 15.6 15.3 15.2  HCT 47.2 45.0 44.9  MCV 92.0  --  90.5  PLT 255  --  814   Basic Metabolic Panel: Recent Labs  Lab 02/15/21 1317 02/15/21 1349  NA 138 139  K 4.0 4.0  CL 105 105  CO2 26  --   GLUCOSE 111* 102*  BUN 16 18  CREATININE 0.77 0.60*  CALCIUM 8.9  --    GFR: Estimated Creatinine Clearance: 94.1 mL/min (A) (by C-G formula based on SCr of 0.6 mg/dL (L)). Liver Function Tests: Recent Labs  Lab 02/15/21 1317  AST 28  ALT 28  ALKPHOS 81  BILITOT 0.9  PROT 6.4*  ALBUMIN 3.8   No results for input(s): LIPASE, AMYLASE in the last 168 hours. No results for input(s): AMMONIA in the last 168 hours. Coagulation Profile: No results for input(s): INR, PROTIME in the last 168 hours. Cardiac Enzymes: No results for input(s): CKTOTAL, CKMB, CKMBINDEX, TROPONINI in the last 168 hours. BNP (last 3 results) No results for input(s): PROBNP in the last 8760 hours. HbA1C: No results for input(s): HGBA1C in the last 72 hours. CBG: No results for input(s): GLUCAP in the last 168 hours. Lipid  Profile: No results for input(s): CHOL, HDL, LDLCALC, TRIG, CHOLHDL, LDLDIRECT in the last 72 hours. Thyroid Function Tests: No results for input(s): TSH, T4TOTAL, FREET4, T3FREE, THYROIDAB in the last 72 hours. Anemia Panel: No results for input(s): VITAMINB12, FOLATE, FERRITIN, TIBC, IRON, RETICCTPCT in the last 72 hours. Urine analysis:    Component Value Date/Time   COLORURINE YELLOW 02/15/2021 1314   APPEARANCEUR CLEAR 02/15/2021 1314   LABSPEC 1.012 02/15/2021 1314   PHURINE 8.0 02/15/2021 1314   GLUCOSEU NEGATIVE 02/15/2021 1314   HGBUR NEGATIVE 02/15/2021 1314   HGBUR trace-lysed 10/31/2010 0844   BILIRUBINUR NEGATIVE 02/15/2021 1314   BILIRUBINUR Negative 04/05/2016 1119   KETONESUR 5 (A) 02/15/2021 1314   PROTEINUR NEGATIVE 02/15/2021 1314   UROBILINOGEN 0.2 04/05/2016 1119   UROBILINOGEN 0.2 10/31/2010 0844   NITRITE NEGATIVE 02/15/2021 1314   LEUKOCYTESUR NEGATIVE 02/15/2021 1314     Elsie Baynes M.D. [facility] 02/17/2021, 12:23 PM  Available via Epic secure chat 7am-7pm After 7 pm, please refer to night coverage provider listed on amion.

## 2021-02-17 NOTE — Progress Notes (Signed)
Occupational Therapy Evaluation Patient Details Name: Adam Daniels MRN: [id number] DOB: 1956/09/03 Today's Date: 02/17/2021    History of Present Illness 65 yo male sustained a burst fracture of L1 and was admitted after falling from ladder at home.  Pt is being assessed for vertebroplasty, and has TLSO for support.  PMHx:  CAD, MI, hypercholesterolemia, colon polyps, diverticulosis, CABG   Clinical Impression   Pt received with above diagnosis. PTA pt living at home with wife, mostly I with ADLs and IADLs, reports being retired and pretty active prior to incident. Pt is currently limited with pain, safety awareness, and limited engagement with ADLs due to decreased ROM and use of brace to safely engage in ADLs. Pt will benefit from continued skilled OT to address established deficits to education in AE, compensatory strategies, body mechanics, and functional transfers. DC recommendation to home with [facility].     Follow Up Recommendations  Home health OT    Equipment Recommendations  3 in 1 bedside commode    Recommendations for Other Services       Precautions / Restrictions Precautions Precautions: Fall;Back Precaution Booklet Issued: Yes (comment) Precaution Comments: reviewed bed mob and transfers (able to state 1/3 precautions - "no arching") Required Braces or Orthoses: Spinal Brace Spinal Brace: Thoracolumbosacral orthotic Restrictions Weight Bearing Restrictions: No      Mobility Bed Mobility Overal bed mobility: Needs Assistance Bed Mobility: Rolling;Sidelying to Sit Rolling: Min assist Sidelying to sit: Min assist       General bed mobility comments: min a for elevation of trunk for safety due to pain.    Transfers Overall transfer level: Needs assistance Equipment used: Rolling walker (2 wheeled);1 person hand held assist Transfers: Sit to/from Stand Sit to Stand: Min assist         General transfer comment: min assist with extra time and reminders to  use hands correctly to power up    Balance Overall balance assessment: Needs assistance;History of Falls Sitting-balance support: Feet supported;Single extremity supported Sitting balance-Leahy Scale: Fair     Standing balance support: Bilateral upper extremity supported;During functional activity Standing balance-Leahy Scale: Fair                             ADL either performed or assessed with clinical judgement   ADL Overall ADL's : Needs assistance/impaired Eating/Feeding: Set up;Sitting   Grooming: Wash/dry hands;Wash/dry face;Oral care;Set up;Sitting Grooming Details (indicate cue type and reason): educated on compensatory strategy to prevent bending of back Upper Body Bathing: Set up;Sitting   Lower Body Bathing: Sit to/from stand;Minimal assistance;With adaptive equipment   Upper Body Dressing : Min guard;Sitting   Lower Body Dressing: Supervision/safety Lower Body Dressing Details (indicate cue type and reason): pt able to present BLE to figure 4 position seated EOB but requires increased time due to limited hip ROM. Toilet Transfer: Minimal assistance       Scientist, research (medical): Medical sales representative Details (indicate cue type and reason): educated of tub transfer with use of BSC, simulated in room. Pt will require additional practice for safety.         Vision         Perception     Praxis      Pertinent Vitals/Pain Pain Assessment: Faces Faces Pain Scale: Hurts little more Pain Location: mid low back Pain Descriptors / Indicators: Grimacing;Guarding Pain Intervention(s): Monitored during session;Repositioned     Hand Dominance Right   Extremity/Trunk  Assessment Upper Extremity Assessment Upper Extremity Assessment: Overall WFL for tasks assessed   Lower Extremity Assessment Lower Extremity Assessment: Defer to PT evaluation   Cervical / Trunk Assessment Cervical / Trunk Assessment: Other exceptions (L1 burst  fracture)   Communication Communication Communication: No difficulties   Cognition Arousal/Alertness: Awake/alert Behavior During Therapy: WFL for tasks assessed/performed Overall Cognitive Status: Within Functional Limits for tasks assessed                                     General Comments       Exercises     Shoulder Instructions      Home Living Family/patient expects to be discharged to:: Private residence Living Arrangements: Spouse/significant other Available Help at Discharge: Family;Available 24 hours/day Type of Home: House Home Access: Stairs to enter [employer] of Steps: 3 Entrance Stairs-Rails: Can reach both;Left;Right Home Layout: Two level;Full bath on main level;Able to live on main level with bedroom/bathroom         Bathroom Toilet: Standard     Home Equipment: Walker - 2 wheels;Bedside commode   Additional Comments: walker belonged to his wife,      Prior Functioning/Environment Level of Independence: Independent        Comments: was climbing a ladder when he fell        OT Problem List: Decreased activity tolerance;Decreased range of motion;Impaired balance (sitting and/or standing);Decreased safety awareness;Decreased knowledge of use of DME or AE;Pain      OT Treatment/Interventions: Self-care/ADL training;Therapeutic exercise;DME and/or AE instruction;Therapeutic activities;Patient/family education;Balance training    OT Goals(Current goals can be found in the care plan section) Acute Rehab OT Goals Patient Stated Goal: to feel better and get stronger OT Goal Formulation: With patient Time For Goal Achievement: 03/03/21 Potential to Achieve Goals: Fair  OT Frequency: Min 2X/week   Barriers to D/C:            Co-evaluation              AM-PAC OT "6 Clicks" Daily Activity     Outcome Measure Help from another person eating meals?: A Little Help from another person taking care of personal  grooming?: A Little Help from another person toileting, which includes using toliet, bedpan, or urinal?: A Little Help from another person bathing (including washing, rinsing, drying)?: A Little Help from another person to put on and taking off regular upper body clothing?: A Little Help from another person to put on and taking off regular lower body clothing?: A Little 6 Click Score: 18   End of Session Equipment Utilized During Treatment: Gait belt;Rolling walker;Back brace Nurse Communication: Mobility status  Activity Tolerance: Patient tolerated treatment well Patient left: in chair;with call bell/phone within reach;with family/visitor present  OT Visit Diagnosis: Unsteadiness on feet (R26.81);Muscle weakness (generalized) (M62.81);History of falling (Z91.81);Pain                Time: 5885-0277 OT Time Calculation (min): 24 min Charges:  OT General Charges $OT Visit: 1 Visit OT Evaluation $OT Eval Low Complexity: 1 Low OT Treatments $Self Care/Home Management : 8-22 mins  Minus Breeding, MSOT, OTR/L  Supplemental Rehabilitation Services  [phone]   Marius Ditch 02/17/2021, 2:54 PM

## 2021-02-17 NOTE — Progress Notes (Signed)
PT Cancellation Note  Patient Details Name: Adam Daniels MRN: [id number] DOB: [birth year]   Cancelled Treatment:    Reason Eval/Treat Not Completed: Other (comment).  Was tired from being x-rayed and OT session so was in bed sleeping.  Asked to wait until tomorrow.  Retry as time and pt allow.   Ramond Dial 02/17/2021, 5:47 PM   Mee Hives, PT MS Acute Rehab Dept. Number: [place]

## 2021-02-18 ENCOUNTER — Inpatient Hospital Stay (HOSPITAL_COMMUNITY): Payer: Medicare PPO

## 2021-02-18 LAB — BASIC METABOLIC PANEL
Anion gap: 5 (ref 5–15)
BUN: 14 mg/dL (ref 8–23)
CO2: 25 mmol/L (ref 22–32)
Calcium: 8.3 mg/dL — ABNORMAL LOW (ref 8.9–10.3)
Chloride: 106 mmol/L (ref 98–111)
Creatinine, Ser: 0.65 mg/dL (ref 0.61–1.24)
GFR, Estimated: >60 mL/min (ref >60)
Glucose, Bld: 86 mg/dL (ref 70–99)
Potassium: 3.9 mmol/L (ref 3.5–5.1)
Sodium: 136 mmol/L (ref 135–145)

## 2021-02-18 LAB — CBC
HCT: 43.2 % (ref 39.0–52.0)
Hemoglobin: 14.5 g/dL (ref 13.0–17.0)
MCH: 30.9 pg (ref 26.0–34.0)
MCHC: 33.6 g/dL (ref 30.0–36.0)
MCV: 91.9 fL (ref 80.0–100.0)
Platelets: 245 10*3/uL (ref 150–400)
RBC: 4.7 MIL/uL (ref 4.22–5.81)
RDW: 13.1 % (ref 11.5–15.5)
WBC: 9.3 10*3/uL (ref 4.0–10.5)
nRBC: 0 % (ref 0.0–0.2)

## 2021-02-18 MED ORDER — BISACODYL 5 MG PO TBEC
5.0000 mg | DELAYED_RELEASE_TABLET | Freq: Once | ORAL | Status: AC
  Administered 2021-02-18: 5 mg via ORAL
  Filled 2021-02-18: qty 1

## 2021-02-18 MED ORDER — LACTULOSE 10 GM/15ML PO SOLN
20.0000 g | Freq: Two times a day (BID) | ORAL | Status: DC
  Administered 2021-02-18 – 2021-02-19 (×3): 20 g via ORAL
  Filled 2021-02-18 (×3): qty 30

## 2021-02-18 MED ORDER — POLYETHYLENE GLYCOL 3350 17 G PO PACK
17.0000 g | PACK | Freq: Every day | ORAL | Status: DC
  Administered 2021-02-18 – 2021-02-20 (×3): 17 g via ORAL
  Filled 2021-02-18 (×3): qty 1

## 2021-02-18 MED ORDER — SORBITOL 70 % SOLN
400.0000 mL | TOPICAL_OIL | Freq: Once | ORAL | Status: AC
  Administered 2021-02-18: 400 mL via RECTAL
  Filled 2021-02-18: qty 120

## 2021-02-18 MED ORDER — TRAMADOL HCL 50 MG PO TABS
50.0000 mg | ORAL_TABLET | Freq: Four times a day (QID) | ORAL | Status: DC | PRN
  Administered 2021-02-18 – 2021-02-20 (×3): 50 mg via ORAL
  Filled 2021-02-18 (×3): qty 1

## 2021-02-18 MED ORDER — DOCUSATE SODIUM 100 MG PO CAPS
100.0000 mg | ORAL_CAPSULE | Freq: Two times a day (BID) | ORAL | Status: DC
  Administered 2021-02-18 – 2021-02-20 (×5): 100 mg via ORAL
  Filled 2021-02-18 (×5): qty 1

## 2021-02-18 NOTE — Progress Notes (Signed)
[facility]                                                                              Patient Demographics  Adam Daniels, is a 65 y.o. male, DOB - Jul 18, 1956, [id number]  Admit date - 02/15/2021   Admitting Physician Lequita Halt, MD  Outpatient Primary MD for the patient is [name], MD  Outpatient specialists:   LOS - 3  days   Medical records reviewed and are as summarized below:    Chief Complaint  Patient presents with  . Fall       Brief summary   Patient is a 65 year old male with history of CAD, MI status post CABG in 2021, hyperlipidemia, chronic back pain presented after a fall from the ladder at home.  Patient reported that he was climbing up a ladder to cut the tree branches when he fell down on concrete.  He fell on his lower back, denied hitting his head.  Since then patient was having low back pain and pain on his left side, sharp and stabbing, nonradiating. CT concerning for L1 compression fracture.  Due to uncontrolled pain, patient was admitted for further work-up, PT OT.  Assessment & Plan    Principal Problem: Acute compression fracture of L1 lumbar vertebra (HCC) -CT L-spine showed acute L1 superior endplate compression fracture with approximately 45% height loss anteriorly.  4 mm retropulsion of the posterior superior endplate.  No additional fractures.  Multilevel disc bulging. -IR evaluation done for kyphoplasty can be done outpatient if conservative management fails -Patient worked with PT on 6/3, recommended home health. -Continue TLSO brace, pain currently controlled. -DC with home health PT once medically stable  Active Problems: Acute nausea and vomiting with ileus, severe constipation -On 6/4, patient was having intractable nausea and vomiting. -Stat abdominal x-ray showed new gaseous distention throughout the redundant large bowel and gas with nondilated small bowel compatible with ileus -Patient was placed on  n.p.o. status, IV fluids and antiemetics -Abdominal x-ray this morning shows moderate stool burden but no SBO, patient had no BM despite multiple stool softeners and laxatives -Placed on clears as no SBO, will advance diet after BM - will give 1 dose of smog enema    HYPERCHOLESTEROLEMIA -Continue Lipitor    CAD (coronary artery disease) -Continue aspirin, Plavix, Lipitor -No chest pain or shortness of breath   Code Status: Full CODE STATUS DVT Prophylaxis:  SCDs Start: 02/15/21 1710   Level of Care: Level of care: Med-Surg Family Communication: Discussed all imaging results, lab results, explained to the patient and discussed with patient's wife today  Disposition Plan:     Status is: Inpatient  Remains inpatient appropriate because:Inpatient level of care appropriate due to severity of illness   Dispo: The patient is from: Home              Anticipated d/c is to: Home              Patient currently is not medically stable to d/c.  Severe constipation,   Difficult to place patient No      Time Spent in minutes 25  Procedures:  CT L-spine  Consultants:   IR  Antimicrobials:   Anti-infectives (From admission, onward)   None         Medications  Scheduled Meds: . aspirin EC  81 mg Oral Daily  . atorvastatin  20 mg Oral Daily  . clopidogrel  75 mg Oral Daily  . docusate sodium  100 mg Oral BID  . lidocaine  1 patch Transdermal Q24H  . polyethylene glycol  17 g Oral Daily  . senna-docusate  1 tablet Oral BID  . sorbitol, milk of mag, mineral oil, glycerin (SMOG) enema  400 mL Rectal Once   Continuous Infusions: . sodium chloride 100 mL/hr at 02/18/21 0703   PRN Meds:.acetaminophen **OR** acetaminophen, cyclobenzaprine, HYDROcodone-acetaminophen, HYDROmorphone (DILAUDID) injection, metoCLOPramide (REGLAN) injection, [DISCONTINUED] ondansetron **OR** ondansetron (ZOFRAN) IV      Subjective:   Adam Daniels was seen and examined today.  Still  feeling uncomfortable, having flatus.  Very small BM yesterday.  Pain in the back+.  No vomiting today.  No fevers.  Objective:   Vitals:   02/17/21 0752 02/17/21 1657 02/17/21 1955 02/18/21 0717  BP: 132/89 112/84 131/82 119/88  Pulse: 93 95 89 75  Resp: 18 17 18 16   Temp: 98 F (36.7 C) 98.4 F (36.9 C) 98.8 F (37.1 C) 97.9 F (36.6 C)  TempSrc: Oral Oral Oral Oral  SpO2: 91% 96% 91% 91%  Weight:      Height:        Intake/Output Summary (Last 24 hours) at 02/18/2021 1333 Last data filed at 02/18/2021 1000 Gross per 24 hour  Intake 725.71 ml  Output 800 ml  Net -74.29 ml     Wt Readings from Last 3 Encounters:  02/15/21 81.6 kg  04/27/20 88.5 kg  02/23/20 86.9 kg   Physical Exam  General: Alert and oriented x 3, still uncomfortable  Cardiovascular: S1 S2 clear, RRR. No pedal edema b/l  Respiratory: CTAB, no wheezing, rales or rhonchi  Gastrointestinal: Soft, minimal tenderness, +bowel sounds  Ext: no pedal edema bilaterally  Neuro: no new deficits  Psych: Normal affect and demeanor, alert and oriented x3    Data Reviewed:  I have personally reviewed following labs and imaging studies  Micro Results Recent Results (from the past 240 hour(s))  SARS CORONAVIRUS 2 (TAT 6-24 HRS) Nasopharyngeal Nasopharyngeal Swab     Status: None   Collection Time: 02/15/21  5:41 PM   Specimen: Nasopharyngeal Swab  Result Value Ref Range Status   SARS Coronavirus 2 NEGATIVE NEGATIVE Final    Comment: (NOTE) SARS-CoV-2 target nucleic acids are NOT DETECTED.  The SARS-CoV-2 RNA is generally detectable in upper and lower respiratory specimens during the acute phase of infection. Negative results do not preclude SARS-CoV-2 infection, do not rule out co-infections with other pathogens, and should not be used as the sole basis for treatment or other patient management decisions. Negative results must be combined with clinical observations, patient history, and epidemiological  information. The expected result is Negative.  Fact Sheet for Patients: SugarRoll.be  Fact Sheet for Healthcare Providers: https://www.woods-mathews.com/  This test is not yet approved or cleared by the [place] FDA and  has been authorized for detection and/or diagnosis of SARS-CoV-2 by FDA under an Emergency Use Authorization (EUA). This EUA will remain  in effect (meaning this test can be used) for the duration of the COVID-19 declaration under Se ction 564(b)(1) of the Act, 21 U.S.C. section 360bbb-3(b)(1), unless the authorization is terminated or revoked sooner.  Performed at [facility], [place] [address], [place], [place] [address]     Radiology Reports CT ABDOMEN PELVIS W CONTRAST  Result Date: 02/15/2021 CLINICAL DATA:  Severe low back pain after being knocked off a ladder by a tree branch. EXAM: CT ABDOMEN AND PELVIS WITH CONTRAST TECHNIQUE: Multidetector CT imaging of the abdomen and pelvis was performed using the standard protocol following bolus administration of intravenous contrast. CONTRAST:  161mL OMNIPAQUE IOHEXOL 300 MG/ML  SOLN COMPARISON:  None. FINDINGS: Lower chest: No acute abnormality. Bilateral lower lobe dependent subsegmental atelectasis. Hepatobiliary: No focal liver abnormality is seen. No gallstones, gallbladder wall thickening, or biliary dilatation. Pancreas: Unremarkable. No pancreatic ductal dilatation or surrounding inflammatory changes. Spleen: Normal in size without focal abnormality. Adrenals/Urinary Tract: The adrenal glands are unremarkable. Small bilateral renal cysts. No renal calculi or hydronephrosis. The bladder is unremarkable for the degree of distention. Stomach/Bowel: Stomach is within normal limits. Appendix appears normal. No evidence of bowel wall thickening, distention, or inflammatory changes. Mild sigmoid diverticulosis. Vascular/Lymphatic: Aortic atherosclerosis. No enlarged abdominal or  pelvic lymph nodes. Reproductive: Mild prostatomegaly. Other: Small bilateral fat containing inguinal hernias. No free fluid or pneumoperitoneum. Musculoskeletal: Acute L1 superior endplate compression fracture with moderate height loss and mild retropulsion. IMPRESSION: 1. Acute L1 superior endplate compression fracture. 2. Aortic Atherosclerosis (ICD10-I70.0). Electronically Signed   By: Titus Dubin M.D.   On: 02/15/2021 15:11   CT L-SPINE NO CHARGE  Result Date: 02/15/2021 CLINICAL DATA:  Severe lower back pain after being knocked off a ladder by a tree branch. EXAM: CT LUMBAR SPINE WITH CONTRAST TECHNIQUE: Technique: Multiplanar CT images of the lumbar spine were reconstructed from contemporary CT of the Abdomen and Pelvis. CONTRAST:  No additional COMPARISON:  None. FINDINGS: Segmentation: 5 lumbar type vertebrae. Alignment: Mild levocurvature.  Sagittal alignment is maintained. Vertebrae: Acute L1 superior endplate compression fracture with approximately 45% height loss anteriorly. 4 mm retropulsion of the posterosuperior endplate. No extension into the posterior elements. No additional fracture. Paraspinal and other soft tissues: Please see separate CT abdomen and pelvis report from same day. Disc levels: Mild multilevel disc bulging. No high-grade spinal canal or neuroforaminal stenosis. IMPRESSION: 1. Acute L1 superior endplate compression fracture as described above. No extension into the posterior elements. Electronically Signed   By: Titus Dubin M.D.   On: 02/15/2021 14:58   DG Chest Portable 1 View  Result Date: 02/15/2021 CLINICAL DATA:  Pain following fall EXAM: PORTABLE CHEST 1 VIEW COMPARISON:  None. FINDINGS: There is slight left base atelectasis. Lungs elsewhere are clear. Heart is upper normal in size with pulmonary vascularity normal. Patient is status post internal mammary bypass grafting. No adenopathy. No evident pneumothorax. No bone lesions. IMPRESSION: Mild left base  atelectasis. Lungs otherwise clear. Heart upper normal in size with postoperative changes. No pneumothorax. Electronically Signed   By: Lowella Grip III M.D.   On: 02/15/2021 13:45   DG Abd Portable 1V  Result Date: 02/18/2021 CLINICAL DATA:  Abdominal distension EXAM: PORTABLE ABDOMEN - 1 VIEW COMPARISON:  February 17, 2021 FINDINGS: There is air throughout bowel. Mild dilatation in transverse colon region noted. Moderate stool in colon. No small bowel dilatation or air-fluid levels. No free air evident on supine examination. There is atelectatic change in the left lung base. Small phleboliths in the pelvis. IMPRESSION: Question a degree of colonic ileus. No bowel obstruction evident. No free air evident on supine examination. Left lower lobe region atelectatic change. Electronically Signed   By:  Lowella Grip III M.D.   On: 02/18/2021 08:02   DG Abd Portable 1V  Result Date: 02/17/2021 CLINICAL DATA:  Ileus, fall, L1 fracture EXAM: PORTABLE ABDOMEN - 1 VIEW COMPARISON:  Portable exam 1720 hours compared to 0942 hours FINDINGS: Persistent gas throughout colon. Stool in RIGHT colon and proximal transverse colon. Bowel gas pattern appears unchanged. No bowel wall thickening or small bowel distension seen. Bones demineralized. IMPRESSION: Slight gaseous distention of colon diffusely, question ileus. Electronically Signed   By: Lavonia Dana M.D.   On: 02/17/2021 18:19   DG Abd Portable 1V  Result Date: 02/17/2021 CLINICAL DATA:  65 year old male with intractable nausea vomiting. Status post fall from ladder. L1 compression fracture. EXAM: PORTABLE ABDOMEN - 1 VIEW COMPARISON:  CT Abdomen and Pelvis 02/15/2021 FINDINGS: Portable AP supine view at 0942 hours. New gas distended large bowel, which is redundant. Gas extends to the rectum. Gas-filled but nondilated small bowel in the abdomen. L1 compression fracture redemonstrated. Stable lung bases. IMPRESSION: 1. New gaseous distension throughout the redundant  large bowel, and gas-filled nondilated small bowel, compatible with ileus. 2. L1 compression fracture redemonstrated. Electronically Signed   By: Genevie Ann M.D.   On: 02/17/2021 09:53    Lab Data:  CBC: Recent Labs  Lab 02/15/21 1317 02/15/21 1349 02/16/21 0145 02/18/21 0258  WBC 12.6*  --  12.2* 9.3  NEUTROABS 10.7*  --   --   --   HGB 15.6 15.3 15.2 14.5  HCT 47.2 45.0 44.9 43.2  MCV 92.0  --  90.5 91.9  PLT 255  --  266 803   Basic Metabolic Panel: Recent Labs  Lab 02/15/21 1317 02/15/21 1349 02/18/21 0258  NA 138 139 136  K 4.0 4.0 3.9  CL 105 105 106  CO2 26  --  25  GLUCOSE 111* 102* 86  BUN 16 18 14   CREATININE 0.77 0.60* 0.65  CALCIUM 8.9  --  8.3*   GFR: Estimated Creatinine Clearance: 94.1 mL/min (by C-G formula based on SCr of 0.65 mg/dL). Liver Function Tests: Recent Labs  Lab 02/15/21 1317  AST 28  ALT 28  ALKPHOS 81  BILITOT 0.9  PROT 6.4*  ALBUMIN 3.8   No results for input(s): LIPASE, AMYLASE in the last 168 hours. No results for input(s): AMMONIA in the last 168 hours. Coagulation Profile: No results for input(s): INR, PROTIME in the last 168 hours. Cardiac Enzymes: No results for input(s): CKTOTAL, CKMB, CKMBINDEX, TROPONINI in the last 168 hours. BNP (last 3 results) No results for input(s): PROBNP in the last 8760 hours. HbA1C: No results for input(s): HGBA1C in the last 72 hours. CBG: No results for input(s): GLUCAP in the last 168 hours. Lipid Profile: No results for input(s): CHOL, HDL, LDLCALC, TRIG, CHOLHDL, LDLDIRECT in the last 72 hours. Thyroid Function Tests: No results for input(s): TSH, T4TOTAL, FREET4, T3FREE, THYROIDAB in the last 72 hours. Anemia Panel: No results for input(s): VITAMINB12, FOLATE, FERRITIN, TIBC, IRON, RETICCTPCT in the last 72 hours. Urine analysis:    Component Value Date/Time   COLORURINE YELLOW 02/15/2021 [place] 02/15/2021 1314   LABSPEC 1.012 02/15/2021 1314   PHURINE 8.0  02/15/2021 1314   GLUCOSEU NEGATIVE 02/15/2021 1314   HGBUR NEGATIVE 02/15/2021 1314   HGBUR trace-lysed 10/31/2010 0844   BILIRUBINUR NEGATIVE 02/15/2021 1314   BILIRUBINUR Negative 04/05/2016 1119   KETONESUR 5 (A) 02/15/2021 1314   PROTEINUR NEGATIVE 02/15/2021 1314   UROBILINOGEN 0.2 04/05/2016 1119  UROBILINOGEN 0.2 10/31/2010 0844   NITRITE NEGATIVE 02/15/2021 1314   LEUKOCYTESUR NEGATIVE 02/15/2021 1314     Pasqualina Colasurdo M.D. [facility] 02/18/2021, 1:33 PM  Available via Epic secure chat 7am-7pm After 7 pm, please refer to night coverage provider listed on amion.

## 2021-02-18 NOTE — TOC Initial Note (Signed)
Transition of Care [facility]) - Initial/Assessment Note    Patient Details  Name: Adam Daniels MRN: [id number] Date of Birth: Nov 03, 1955  Transition [facility]) CM/SW Contact:    Bartholomew Crews, RN Phone Number: [phone] 02/18/2021, 10:46 AM  Clinical Narrative:                  Spoke with patient at the bedside. Spouse and daughter at the bedside. Discussed plans for transition home. Agreeable to [facility] PT/OT - referral accepted by [facility]. Patient will need HH PT, OT and Face to Face order at discharge.  Has all needed DME. TOC following for transition needs.   Expected Discharge Plan: [place] Barriers to Discharge: Continued Medical Work up   Patient Goals and CMS Choice Patient states their goals for this hospitalization and ongoing recovery are:: return home with wife CMS Medicare.gov Compare Post Acute Care list provided to:: Patient Choice offered to / list presented to : Patient  Expected Discharge Plan and Services Expected Discharge Plan: [place]   Discharge Planning Services: NA Post Acute Care Choice: [place] arrangements for the past 2 months: Single Family Home                 DME Arranged: N/A DME Agency: NA       HH Arranged: PT,OT [place] Agency: [place] Date [place]: 02/18/21 Time HH Agency Contacted: 39 Representative spoke with at [place]: [place]  Prior Living Arrangements/Services Living arrangements for the past 2 months: [place] with:: Self,Spouse Patient language and need for interpreter reviewed:: Yes Do you feel safe going back to the place where you live?: Yes      Need for Family Participation in Patient Care: Yes (Comment) Care giver support system in place?: Yes (comment) Current home services: DME (walker, 3N1, cane) Criminal Activity/Legal Involvement Pertinent to Current Situation/Hospitalization: No - Comment as needed  Activities of Daily  Living Home Assistive Devices/Equipment: None ADL Screening (condition at time of admission) Patient's cognitive ability adequate to safely complete daily activities?: Yes Is the patient deaf or have difficulty hearing?: Yes Does the patient have difficulty seeing, even when wearing glasses/contacts?: No Does the patient have difficulty concentrating, remembering, or making decisions?: No Patient able to express need for assistance with ADLs?: Yes Does the patient have difficulty dressing or bathing?: No Independently performs ADLs?: Yes (appropriate for developmental age) Does the patient have difficulty walking or climbing stairs?: No Weakness of Legs: Both Weakness of Arms/Hands: None  Permission Sought/Granted Permission sought to share information with : Family Supports Permission granted to share information with : Yes, Verbal Permission Granted  Share Information with NAME: Okey Zelek     Permission granted to share info w Relationship: spouse  Permission granted to share info w Contact Information: [phone]  Emotional Assessment Appearance:: Appears stated age Attitude/Demeanor/Rapport: Engaged Affect (typically observed): Accepting Orientation: : Oriented to Self,Oriented to Place,Oriented to  Time,Oriented to Situation Alcohol / Substance Use: Not Applicable Psych Involvement: No (comment)  Admission diagnosis:  Lumbar back sprain [S33.5XXA] L1 vertebral fracture ([place]) [S32.019A] Fall, initial encounter [W19.XXXA] Compression fracture of L1 vertebra, initial encounter [facility]) [M35.361W] Patient Active Problem List   Diagnosis Date Noted  . CAD (coronary artery disease) 02/16/2021  . Lumbar burst fracture, closed, initial encounter ([place]) 02/15/2021  . L1 vertebral fracture ([place]) 02/15/2021  . Compression fracture of L1 lumbar vertebra (HCC)   . History  of heart attack 07/21/2020  . Overweight 04/05/2016  . Alcohol use 04/05/2016  . External hemorrhoid 05/19/2012   . Healthcare maintenance 12/26/2011  . Personal history of colonic polyps-adenoma 12/11/2011  . HYPERCHOLESTEROLEMIA 11/29/2007  . ALLERGIC RHINITIS 11/25/2007  . Diverticulosis of colon 12/01/2006   PCP:  [name], MD Pharmacy:   [id number] [address], [place] [address] Phone: [phone] Fax: [phone]     Social Determinants of Health (SDOH) Interventions    Readmission Risk Interventions No flowsheet data found.

## 2021-02-18 NOTE — Progress Notes (Signed)
Physical Therapy Treatment Patient Details Name: Adam Daniels MRN: [id number] DOB: Apr 05, 1956 Today's Date: 02/18/2021    History of Present Illness Pt is a 65 yo male admitted after falling from ladder and found to have a burst fracture of L1. No surgery while admitted, IR opting for conservative measures first. PMHx:  CAD, MI, hypercholesterolemia, colon polyps, diverticulosis, recent CABG    PT Comments    Pt continuing to make steady progress with his functional mobility as indicated by his need for less physical assistance than previous PT session. He reported no pain throughout. Pt would continue to benefit from skilled physical therapy services at this time while admitted and after d/c to address the below listed limitations in order to improve overall safety and independence with functional mobility.    Follow Up Recommendations  Home health PT;Supervision for mobility/OOB     Equipment Recommendations  Other (comment) (pt reported that he already has a RW at home)    Recommendations for Other Services       Precautions / Restrictions Precautions Precautions: Fall;Back Precaution Comments: pt demonstrating excellent log roll technique Required Braces or Orthoses: Spinal Brace Spinal Brace: Thoracolumbosacral orthotic Restrictions Weight Bearing Restrictions: No    Mobility  Bed Mobility Overal bed mobility: Needs Assistance Bed Mobility: Rolling;Sidelying to Sit Rolling: Supervision Sidelying to sit: Supervision       General bed mobility comments: increased time and effort needed, HOB flat, supervision for safety    Transfers Overall transfer level: Needs assistance Equipment used: Rolling walker (2 wheeled) Transfers: Sit to/from Stand Sit to Stand: Supervision         General transfer comment: good technique utilized, steady with transition overall  Ambulation/Gait Ambulation/Gait assistance: Supervision Gait Distance (Feet): 200 Feet Assistive  device: Rolling walker (2 wheeled) Gait Pattern/deviations: Step-through pattern;Decreased stride length Gait velocity: decreased   General Gait Details: pt with slow, guarded gait with appropriate use of the RW, no overt LOB or need for physical assistance   Stairs             Wheelchair Mobility    Modified Rankin (Stroke Patients Only)       Balance Overall balance assessment: Needs assistance Sitting-balance support: Feet supported Sitting balance-Leahy Scale: Good     Standing balance support: During functional activity Standing balance-Leahy Scale: Fair                              Cognition Arousal/Alertness: Awake/alert Behavior During Therapy: WFL for tasks assessed/performed Overall Cognitive Status: Within Functional Limits for tasks assessed                                        Exercises      General Comments        Pertinent Vitals/Pain Pain Assessment: No/denies pain    Home Living                      Prior Function            PT Goals (current goals can now be found in the care plan section) Acute Rehab PT Goals PT Goal Formulation: With patient/family Time For Goal Achievement: 03/02/21 Potential to Achieve Goals: Good Progress towards PT goals: Progressing toward goals    Frequency    Min 5X/week  PT Plan Current plan remains appropriate    Co-evaluation              AM-PAC PT "6 Clicks" Mobility   Outcome Measure  Help needed turning from your back to your side while in a flat bed without using bedrails?: None Help needed moving from lying on your back to sitting on the side of a flat bed without using bedrails?: None Help needed moving to and from a bed to a chair (including a wheelchair)?: None Help needed standing up from a chair using your arms (e.g., wheelchair or bedside chair)?: None Help needed to walk in hospital room?: None Help needed climbing 3-5 steps with  a railing? : A Little 6 Click Score: 23    End of Session Equipment Utilized During Treatment: Back brace Activity Tolerance: Patient tolerated treatment well Patient left: in chair;with call bell/phone within reach Nurse Communication: Mobility status PT Visit Diagnosis: Other abnormalities of gait and mobility (R26.89)     Time: 4383-7793 PT Time Calculation (min) (ACUTE ONLY): 21 min  Charges:  $Gait Training: 8-22 mins                     Anastasio Champion, DPT  Acute Rehabilitation Services Pager [phone] Office [place] 02/18/2021, 10:14 AM

## 2021-02-18 NOTE — Plan of Care (Signed)
  Problem: Activity: Goal: Risk for activity intolerance will decrease Outcome: Progressing   Problem: Elimination: Goal: Will not experience complications related to bowel motility Outcome: Progressing   Problem: Safety: Goal: Ability to remain free from injury will improve Outcome: Progressing   

## 2021-02-19 ENCOUNTER — Inpatient Hospital Stay (HOSPITAL_COMMUNITY): Payer: Medicare PPO

## 2021-02-19 DIAGNOSIS — R933 Abnormal findings on diagnostic imaging of other parts of digestive tract: Secondary | ICD-10-CM

## 2021-02-19 DIAGNOSIS — K560 Paralytic ileus: Secondary | ICD-10-CM

## 2021-02-19 LAB — PHOSPHORUS: Phosphorus: 2.8 mg/dL (ref 2.5–4.6)

## 2021-02-19 LAB — MAGNESIUM: Magnesium: 2.1 mg/dL (ref 1.7–2.4)

## 2021-02-19 MED ORDER — IOHEXOL 9 MG/ML PO SOLN
ORAL | Status: AC
  Filled 2021-02-19: qty 1000

## 2021-02-19 MED ORDER — NALOXEGOL OXALATE 25 MG PO TABS
25.0000 mg | ORAL_TABLET | Freq: Every day | ORAL | Status: AC
  Administered 2021-02-19 – 2021-02-20 (×2): 25 mg via ORAL
  Filled 2021-02-19 (×2): qty 1

## 2021-02-19 NOTE — Progress Notes (Signed)
Physical Therapy Treatment Patient Details Name: Adam Daniels MRN: [id number] DOB: 05-15-1956 Today's Date: 02/19/2021    History of Present Illness Pt is a 65 yo male admitted after falling from ladder and found to have a burst fracture of L1. No surgery while admitted, IR opting for conservative measures first. PMHx:  CAD, MI, hypercholesterolemia, colon polyps, diverticulosis, recent CABG    PT Comments    Pt was seen for mobility after mult attempts today, and was found with brace off in BR.  Talked with him about wearing brace as a protection for spine, and reviewed precautions based on observation of pt forgetting them in practice.  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  Follow for further PT as hosp stay permits.   Follow Up Recommendations  Home health PT;Supervision for mobility/OOB     Equipment Recommendations  None recommended by PT    Recommendations for Other Services       Precautions / Restrictions Precautions Precautions: Fall;Back Precaution Booklet Issued: Yes (comment) Required Braces or Orthoses: Spinal Brace Spinal Brace: Thoracolumbosacral orthotic Restrictions Weight Bearing Restrictions: No    Mobility  Bed Mobility Overal bed mobility: Needs Assistance Bed Mobility: Sidelying to Sit;Rolling;Sit to Sidelying Rolling: Supervision Sidelying to sit: Supervision     Sit to sidelying: Supervision General bed mobility comments: verbalized instructions to aid compliance    Transfers Overall transfer level: Needs assistance Equipment used: Rolling walker (2 wheeled) Transfers: Sit to/from Stand Sit to Stand: Min guard         General transfer comment: reminders for hand placement at times  Ambulation/Gait Ambulation/Gait assistance: Min guard Gait Distance (Feet): 250  Feet Assistive device: Rolling walker (2 wheeled) Gait Pattern/deviations: Step-through pattern;Narrow base of support Gait velocity: decreased Gait velocity interpretation: <1.31 ft/sec, indicative of household ambulator General Gait Details: improved posture and control of balance with brace in place   Stairs             Wheelchair Mobility    Modified Rankin (Stroke Patients Only)       Balance Overall balance assessment: Needs assistance Sitting-balance support: Feet supported Sitting balance-Leahy Scale: Good       Standing balance-Leahy Scale: Fair Standing balance comment: brace is helpful to control balance                            Cognition Arousal/Alertness: Awake/alert Behavior During Therapy: WFL for tasks assessed/performed Overall Cognitive Status: Within Functional Limits for tasks assessed                                        Exercises      General Comments General comments (skin integrity, edema, etc.): family are always present to observe and ask questions      Pertinent Vitals/Pain Pain Assessment: 0-10 Pain Score: 2  Pain Location: mid low back Pain Descriptors / Indicators: Guarding Pain Intervention(s): Monitored during session;Premedicated before session;Repositioned    Home Living                      Prior Function            PT Goals (current goals can now be found  in the care plan section) Acute Rehab PT Goals Patient Stated Goal: to feel better and get stronger Progress towards PT goals: Progressing toward goals    Frequency    Min 5X/week      PT Plan Current plan remains appropriate    Co-evaluation              AM-PAC PT "6 Clicks" Mobility   Outcome Measure  Help needed turning from your back to your side while in a flat bed without using bedrails?: None Help needed moving from lying on your back to sitting on the side of a flat bed without using bedrails?:  None Help needed moving to and from a bed to a chair (including a wheelchair)?: A Little Help needed standing up from a chair using your arms (e.g., wheelchair or bedside chair)?: A Little Help needed to walk in hospital room?: A Little Help needed climbing 3-5 steps with a railing? : A Little 6 Click Score: 20    End of Session Equipment Utilized During Treatment: Back brace Activity Tolerance: Patient tolerated treatment well Patient left: in bed;with call bell/phone within reach;with bed alarm set;with family/visitor present Nurse Communication: Mobility status PT Visit Diagnosis: Other abnormalities of gait and mobility (R26.89)     Time: 1539-1601 PT Time Calculation (min) (ACUTE ONLY): 22 min  Charges:  $Gait Training: 8-22 mins                Ramond Dial 02/19/2021, 9:32 PM  Mee Hives, PT MS Acute Rehab Dept. Number: [place] and [place]

## 2021-02-19 NOTE — Progress Notes (Signed)
Occupational Therapy Treatment Patient Details Name: Adam Daniels MRN: [id number] DOB: [birth year] Today's Date: 02/19/2021    History of present illness Pt is a 65 yo male admitted after falling from ladder and found to have a burst fracture of L1. No surgery while admitted, IR opting for conservative measures first. PMHx:  CAD, MI, hypercholesterolemia, colon polyps, diverticulosis, recent CABG   OT comments  Pt making steady progress towards OT goals this session. Pt reports having just worked with PT but agreeable to educational OT  session. Pt able to state 3/3 back precautions with pt reporting most concern with being able to complete posterior pericare. Provided education and demonstration of AE for pericare with pt verbalizing understanding. Pt able to verbalize correct brace wearing schedule and provided education on correct placement of straps on TLSO. Pt would continue to benefit from skilled occupational therapy while admitted and after d/c to address the below listed limitations in order to improve overall functional mobility and facilitate independence with BADL participation. DC plan remains appropriate, will follow acutely per POC.     Follow Up Recommendations  Home health OT    Equipment Recommendations  3 in 1 bedside commode    Recommendations for Other Services      Precautions / Restrictions Precautions Precautions: Fall;Back Precaution Booklet Issued: Yes (comment) Precaution Comments: pt able to state 3/3 precautions Required Braces or Orthoses: Spinal Brace Spinal Brace: Thoracolumbosacral orthotic Restrictions Weight Bearing Restrictions: No       Mobility Bed Mobility                    Transfers                      Balance                                           ADL either performed or assessed with clinical judgement   ADL Overall ADL's : Needs assistance/impaired               Lower Body Bathing  Details (indicate cue type and reason): education provided on AE for LB ADLs   Upper Body Dressing Details (indicate cue type and reason): pt reports no issues with donning brace and is able to verablize technique   Lower Body Dressing Details (indicate cue type and reason): pt reports no issues with figure four technique   Toilet Transfer Details (indicate cue type and reason): pt declined OOB transfer   Toileting - Clothing Manipulation Details (indicate cue type and reason): education and demo provided on using toileting aid in order to maintain back precautions during pericare   Tub/Shower Transfer Details (indicate cue type and reason): pt reports walkin showr with 3n1 as showe seat   General ADL Comments: pt continues to present with back precautions impacting pts ability to complete BADLs independently, session focus on education related to LB AE and compensatory methods for completing BADLs in compliance with back precautions     Vision       Perception     Praxis      Cognition Arousal/Alertness: Awake/alert Behavior During Therapy: WFL for tasks assessed/performed Overall Cognitive Status: Within Functional Limits for tasks assessed  General Comments: very recepetive and appreciative of education        Exercises     Shoulder Instructions       General Comments pts family members present during session, education provided on LB AE, brace schedule and donning<>doffing of brace as well as reminding pt to always protect skin integrity when wearing brace    Pertinent Vitals/ Pain       Pain Assessment: No/denies pain  Home Living                                          Prior Functioning/Environment              Frequency  Min 2X/week        Progress Toward Goals  OT Goals(current goals can now be found in the care plan section)  Progress towards OT goals: Progressing toward  goals  Acute Rehab OT Goals Patient Stated Goal: to feel better and get stronger OT Goal Formulation: With patient Time For Goal Achievement: 03/03/21 Potential to Achieve Goals: [place] Discharge plan remains appropriate;Frequency remains appropriate    Co-evaluation                 AM-PAC OT "6 Clicks" Daily Activity     Outcome Measure   Help from another person eating meals?: None Help from another person taking care of personal grooming?: A Little Help from another person toileting, which includes using toliet, bedpan, or urinal?: A Lot Help from another person bathing (including washing, rinsing, drying)?: A Lot Help from another person to put on and taking off regular upper body clothing?: A Little Help from another person to put on and taking off regular lower body clothing?: A Little 6 Click Score: 17    End of Session    OT Visit Diagnosis: Unsteadiness on feet (R26.81);Muscle weakness (generalized) (M62.81);History of falling (Z91.81)   Activity Tolerance Patient tolerated treatment well   Patient Left in bed;with call bell/phone within reach;with bed alarm set;with family/visitor present   Nurse Communication          Time: 7353-2992 OT Time Calculation (min): 19 min  Charges: OT General Charges $OT Visit: 1 Visit OT Treatments $Self Care/Home Management : 8-22 mins  [name], COTA/L Acute Rehabilitation Services [phone]    Precious Haws 02/19/2021, 4:56 PM
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
## 2021-02-19 NOTE — Progress Notes (Addendum)
[facility]                                                                              Patient Demographics  Adam Daniels, is a 65 y.o. male, DOB - July 10, 1956, ATF:[id number]  Admit date - 02/15/2021   Admitting Physician Lequita Halt, MD  Outpatient Primary MD for the patient is [name], MD  Outpatient specialists:   LOS - 4  days   Medical records reviewed and are as summarized below:    Chief Complaint  Patient presents with  . Fall       Brief summary   Patient is a 65 year old male with history of CAD, MI status post CABG in 2021, hyperlipidemia, chronic back pain presented after a fall from the ladder at home.  Patient reported that he was climbing up a ladder to cut the tree branches when he fell down on concrete.  He fell on his lower back, denied hitting his head.  Since then patient was having low back pain and pain on his left side, sharp and stabbing, nonradiating. CT concerning for L1 compression fracture.  Due to uncontrolled pain, patient was admitted for further work-up, PT OT.  Assessment & Plan    Principal Problem: Acute compression fracture of L1 lumbar vertebra (HCC) -CT L-spine showed acute L1 superior endplate compression fracture with approximately 45% height loss anteriorly.  4 mm retropulsion of the posterior superior endplate.  No additional fractures.  Multilevel disc bulging. -IR evaluation done for kyphoplasty can be done outpatient if conservative management fails -PT recommended home health -Continue TLSO brace.  Recommended patient to increase mobility and ambulate which will also help with the ileus and constipation  Active Problems: Acute nausea and vomiting with ileus, severe constipation -On 6/4, patient was having intractable nausea and vomiting.  Stat abdominal x-ray showed new gaseous distention throughout the redundant large bowel and gas with nondilated small bowel compatible with ileus -X-ray 6/5 showed  moderate stool burden, no SBO.  Patient had 2 small BMs last night, had received smog enema and multiple stool softeners, laxatives -Was placed on clear liquid diet, advance to full liquids this morning.  - IV Dilaudid and hydrocodone were discontinued, placed on tramadol as needed for pain, increase mobility -Today complaining of left lower quadrant abdominal pain, will obtain CT abdomen and pelvis -If CT abdomen pelvis is negative for any acute intra-abdominal pathology, will give Movantik  Addendum 1:40pm CT abdomen shows no obstruction but still dilated cecum and ascending/transverse/descending colon with air-fluid levels with colonic ileus -Discussed with general surgery, recommended GI consult for possible decompression with neostigmine or colonoscopy.  GI consulted.    HYPERCHOLESTEROLEMIA -Continue Lipitor    CAD (coronary artery disease) -Continue aspirin, Plavix, Lipitor -No chest pain or shortness of breath   Code Status: Full CODE STATUS DVT Prophylaxis:  SCDs Start: 02/15/21 1710   Level of Care: Level of care: Med-Surg Family Communication: Discussed all imaging results, lab results, explained to the patient and discussed with patient's wife and daughter at the bedside  Disposition Plan:     Status is: Inpatient  Remains inpatient appropriate because:Inpatient level  of care appropriate due to severity of illness   Dispo: The patient is from: Home              Anticipated d/c is to: Home              Patient currently is not medically stable to d/c.  Severe constipation, ileus   Difficult to place patient No      Time Spent in minutes 25   Procedures:  CT L-spine  Consultants:   IR  Antimicrobials:   Anti-infectives (From admission, onward)   None         Medications  Scheduled Meds: . aspirin EC  81 mg Oral Daily  . atorvastatin  20 mg Oral Daily  . clopidogrel  75 mg Oral Daily  . docusate sodium  100 mg Oral BID  . lactulose  20 g Oral  BID  . lidocaine  1 patch Transdermal Q24H  . polyethylene glycol  17 g Oral Daily  . senna-docusate  1 tablet Oral BID   Continuous Infusions: . sodium chloride 100 mL/hr at 02/19/21 0505   PRN Meds:.acetaminophen **OR** acetaminophen, cyclobenzaprine, metoCLOPramide (REGLAN) injection, [DISCONTINUED] ondansetron **OR** ondansetron (ZOFRAN) IV, traMADol      Subjective:   Adam Daniels was seen and examined today.  Had 2 small BMs last night.  Passing flatus.  Feeling overall unwell however states that he was able to ambulate yesterday.  Patient was examined twice this morning.  Second time he reported left lower quadrant abdominal pain.  Family in the room.  No active nausea or vomiting, chest pain or shortness of breath  Objective:   Vitals:   02/18/21 2013 02/18/21 2048 02/19/21 0506 02/19/21 0838  BP: (!) 127/93  (!) 131/91 130/88  Pulse: 87  83 77  Resp: 17 16 16 17   Temp: 97.9 F (36.6 C)  97.8 F (36.6 C) 97.7 F (36.5 C)  TempSrc: Oral  Oral Oral  SpO2: 93%  94% 93%  Weight:      Height:        Intake/Output Summary (Last 24 hours) at 02/19/2021 1150 Last data filed at 02/19/2021 0500 Gross per 24 hour  Intake --  Output 600 ml  Net -600 ml     Wt Readings from Last 3 Encounters:  02/15/21 81.6 kg  04/27/20 88.5 kg  02/23/20 86.9 kg    Physical Exam  General: Alert and oriented x 3, somewhat ill-appearing and uncomfortable  Cardiovascular: S1 S2 clear, RRR. No pedal edema b/l  Respiratory: CTAB, no wheezing, rales or rhonchi  Gastrointestinal: Soft, mild diffuse TTP, + BS   Ext: no pedal edema bilaterally  Neuro: no new deficits  Skin: No rashes  Psych: flat affect    Data Reviewed:  I have personally reviewed following labs and imaging studies  Micro Results Recent Results (from the past 240 hour(s))  SARS CORONAVIRUS 2 (TAT 6-24 HRS) Nasopharyngeal Nasopharyngeal Swab     Status: None   Collection Time: 02/15/21  5:41 PM   Specimen:  Nasopharyngeal Swab  Result Value Ref Range Status   SARS Coronavirus 2 NEGATIVE NEGATIVE Final    Comment: (NOTE) SARS-CoV-2 target nucleic acids are NOT DETECTED.  The SARS-CoV-2 RNA is generally detectable in upper and lower respiratory specimens during the acute phase of infection. Negative results do not preclude SARS-CoV-2 infection, do not rule out co-infections with other pathogens, and should not be used as the sole basis for treatment or other patient management decisions. Negative  results must be combined with clinical observations, patient history, and epidemiological information. The expected result is Negative.  Fact Sheet for Patients: SugarRoll.be  Fact Sheet for Healthcare Providers: https://www.woods-mathews.com/  This test is not yet approved or cleared by the [place] FDA and  has been authorized for detection and/or diagnosis of SARS-CoV-2 by FDA under an Emergency Use Authorization (EUA). This EUA will remain  in effect (meaning this test can be used) for the duration of the COVID-19 declaration under Se ction 564(b)(1) of the Act, 21 U.S.C. section 360bbb-3(b)(1), unless the authorization is terminated or revoked sooner.  Performed at [facility], [place] [address] [place], [place] [address]     Radiology Reports CT ABDOMEN PELVIS W CONTRAST  Result Date: 02/15/2021 CLINICAL DATA:  Severe low back pain after being knocked off a ladder by a tree branch. EXAM: CT ABDOMEN AND PELVIS WITH CONTRAST TECHNIQUE: Multidetector CT imaging of the abdomen and pelvis was performed using the standard protocol following bolus administration of intravenous contrast. CONTRAST:  134mL OMNIPAQUE IOHEXOL 300 MG/ML  SOLN COMPARISON:  None. FINDINGS: Lower chest: No acute abnormality. Bilateral lower lobe dependent subsegmental atelectasis. Hepatobiliary: No focal liver abnormality is seen. No gallstones, gallbladder wall  thickening, or biliary dilatation. Pancreas: Unremarkable. No pancreatic ductal dilatation or surrounding inflammatory changes. Spleen: Normal in size without focal abnormality. Adrenals/Urinary Tract: The adrenal glands are unremarkable. Small bilateral renal cysts. No renal calculi or hydronephrosis. The bladder is unremarkable for the degree of distention. Stomach/Bowel: Stomach is within normal limits. Appendix appears normal. No evidence of bowel wall thickening, distention, or inflammatory changes. Mild sigmoid diverticulosis. Vascular/Lymphatic: Aortic atherosclerosis. No enlarged abdominal or pelvic lymph nodes. Reproductive: Mild prostatomegaly. Other: Small bilateral fat containing inguinal hernias. No free fluid or pneumoperitoneum. Musculoskeletal: Acute L1 superior endplate compression fracture with moderate height loss and mild retropulsion. IMPRESSION: 1. Acute L1 superior endplate compression fracture. 2. Aortic Atherosclerosis (ICD10-I70.0). Electronically Signed   By: Titus Dubin M.D.   On: 02/15/2021 15:11   CT L-SPINE NO CHARGE  Result Date: 02/15/2021 CLINICAL DATA:  Severe lower back pain after being knocked off a ladder by a tree branch. EXAM: CT LUMBAR SPINE WITH CONTRAST TECHNIQUE: Technique: Multiplanar CT images of the lumbar spine were reconstructed from contemporary CT of the Abdomen and Pelvis. CONTRAST:  No additional COMPARISON:  None. FINDINGS: Segmentation: 5 lumbar type vertebrae. Alignment: Mild levocurvature.  Sagittal alignment is maintained. Vertebrae: Acute L1 superior endplate compression fracture with approximately 45% height loss anteriorly. 4 mm retropulsion of the posterosuperior endplate. No extension into the posterior elements. No additional fracture. Paraspinal and other soft tissues: Please see separate CT abdomen and pelvis report from same day. Disc levels: Mild multilevel disc bulging. No high-grade spinal canal or neuroforaminal stenosis. IMPRESSION: 1.  Acute L1 superior endplate compression fracture as described above. No extension into the posterior elements. Electronically Signed   By: Titus Dubin M.D.   On: 02/15/2021 14:58   DG Chest Portable 1 View  Result Date: 02/15/2021 CLINICAL DATA:  Pain following fall EXAM: PORTABLE CHEST 1 VIEW COMPARISON:  None. FINDINGS: There is slight left base atelectasis. Lungs elsewhere are clear. Heart is upper normal in size with pulmonary vascularity normal. Patient is status post internal mammary bypass grafting. No adenopathy. No evident pneumothorax. No bone lesions. IMPRESSION: Mild left base atelectasis. Lungs otherwise clear. Heart upper normal in size with postoperative changes. No pneumothorax. Electronically Signed   By: Lowella Grip III M.D.  On: 02/15/2021 13:45   DG Abd Portable 1V  Result Date: 02/18/2021 CLINICAL DATA:  Abdominal distension EXAM: PORTABLE ABDOMEN - 1 VIEW COMPARISON:  February 17, 2021 FINDINGS: There is air throughout bowel. Mild dilatation in transverse colon region noted. Moderate stool in colon. No small bowel dilatation or air-fluid levels. No free air evident on supine examination. There is atelectatic change in the left lung base. Small phleboliths in the pelvis. IMPRESSION: Question a degree of colonic ileus. No bowel obstruction evident. No free air evident on supine examination. Left lower lobe region atelectatic change. Electronically Signed   By: Lowella Grip III M.D.   On: 02/18/2021 08:02   DG Abd Portable 1V  Result Date: 02/17/2021 CLINICAL DATA:  Ileus, fall, L1 fracture EXAM: PORTABLE ABDOMEN - 1 VIEW COMPARISON:  Portable exam 1720 hours compared to 0942 hours FINDINGS: Persistent gas throughout colon. Stool in RIGHT colon and proximal transverse colon. Bowel gas pattern appears unchanged. No bowel wall thickening or small bowel distension seen. Bones demineralized. IMPRESSION: Slight gaseous distention of colon diffusely, question ileus. Electronically  Signed   By: Lavonia Dana M.D.   On: 02/17/2021 18:19   DG Abd Portable 1V  Result Date: 02/17/2021 CLINICAL DATA:  65 year old male with intractable nausea vomiting. Status post fall from ladder. L1 compression fracture. EXAM: PORTABLE ABDOMEN - 1 VIEW COMPARISON:  CT Abdomen and Pelvis 02/15/2021 FINDINGS: Portable AP supine view at 0942 hours. New gas distended large bowel, which is redundant. Gas extends to the rectum. Gas-filled but nondilated small bowel in the abdomen. L1 compression fracture redemonstrated. Stable lung bases. IMPRESSION: 1. New gaseous distension throughout the redundant large bowel, and gas-filled nondilated small bowel, compatible with ileus. 2. L1 compression fracture redemonstrated. Electronically Signed   By: Genevie Ann M.D.   On: 02/17/2021 09:53    Lab Data:  CBC: Recent Labs  Lab 02/15/21 1317 02/15/21 1349 02/16/21 0145 02/18/21 0258  WBC 12.6*  --  12.2* 9.3  NEUTROABS 10.7*  --   --   --   HGB 15.6 15.3 15.2 14.5  HCT 47.2 45.0 44.9 43.2  MCV 92.0  --  90.5 91.9  PLT 255  --  266 628   Basic Metabolic Panel: Recent Labs  Lab 02/15/21 1317 02/15/21 1349 02/18/21 0258  NA 138 139 136  K 4.0 4.0 3.9  CL 105 105 106  CO2 26  --  25  GLUCOSE 111* 102* 86  BUN 16 18 14   CREATININE 0.77 0.60* 0.65  CALCIUM 8.9  --  8.3*   GFR: Estimated Creatinine Clearance: 94.1 mL/min (by C-G formula based on SCr of 0.65 mg/dL). Liver Function Tests: Recent Labs  Lab 02/15/21 1317  AST 28  ALT 28  ALKPHOS 81  BILITOT 0.9  PROT 6.4*  ALBUMIN 3.8   No results for input(s): LIPASE, AMYLASE in the last 168 hours. No results for input(s): AMMONIA in the last 168 hours. Coagulation Profile: No results for input(s): INR, PROTIME in the last 168 hours. Cardiac Enzymes: No results for input(s): CKTOTAL, CKMB, CKMBINDEX, TROPONINI in the last 168 hours. BNP (last 3 results) No results for input(s): PROBNP in the last 8760 hours. HbA1C: No results for  input(s): HGBA1C in the last 72 hours. CBG: No results for input(s): GLUCAP in the last 168 hours. Lipid Profile: No results for input(s): CHOL, HDL, LDLCALC, TRIG, CHOLHDL, LDLDIRECT in the last 72 hours. Thyroid Function Tests: No results for input(s): TSH, T4TOTAL, FREET4, T3FREE, THYROIDAB in  the last 72 hours. Anemia Panel: No results for input(s): VITAMINB12, FOLATE, FERRITIN, TIBC, IRON, RETICCTPCT in the last 72 hours. Urine analysis:    Component Value Date/Time   COLORURINE YELLOW 02/15/2021 1314   APPEARANCEUR CLEAR 02/15/2021 1314   LABSPEC 1.012 02/15/2021 1314   PHURINE 8.0 02/15/2021 1314   GLUCOSEU NEGATIVE 02/15/2021 1314   HGBUR NEGATIVE 02/15/2021 1314   HGBUR trace-lysed 10/31/2010 0844   BILIRUBINUR NEGATIVE 02/15/2021 1314   BILIRUBINUR Negative 04/05/2016 1119   KETONESUR 5 (A) 02/15/2021 1314   PROTEINUR NEGATIVE 02/15/2021 1314   UROBILINOGEN 0.2 04/05/2016 1119   UROBILINOGEN 0.2 10/31/2010 0844   NITRITE NEGATIVE 02/15/2021 1314   LEUKOCYTESUR NEGATIVE 02/15/2021 1314     Manaia Samad M.D. [facility] 02/19/2021, 11:50 AM  Available via Epic secure chat 7am-7pm After 7 pm, please refer to night coverage provider listed on amion.

## 2021-02-20 MED ORDER — METHOCARBAMOL 750 MG PO TABS: 750.0000 mg | ORAL_TABLET | Freq: Three times a day (TID) | ORAL | 0 refills | Status: DC | PRN

## 2021-02-20 MED ORDER — LIDOCAINE 5 % EX PTCH
1.0000 | MEDICATED_PATCH | CUTANEOUS | 0 refills | Status: DC
Start: 2021-02-21 — End: 2021-03-06

## 2021-02-20 MED ORDER — TRAMADOL HCL 50 MG PO TABS: 50.0000 mg | ORAL_TABLET | Freq: Three times a day (TID) | ORAL | 0 refills | Status: DC | PRN

## 2021-02-20 MED ORDER — CYCLOBENZAPRINE HCL 10 MG PO TABS
10.0000 mg | ORAL_TABLET | Freq: Once | ORAL | Status: AC
  Administered 2021-02-20: 10 mg via ORAL
  Filled 2021-02-20: qty 1

## 2021-02-20 MED ORDER — ACETAMINOPHEN 500 MG PO TABS: 500.0000 mg | ORAL_TABLET | Freq: Four times a day (QID) | ORAL | Status: DC | PRN

## 2021-02-20 MED ORDER — POLYETHYLENE GLYCOL 3350 17 G PO PACK: 17.0000 g | PACK | Freq: Every day | ORAL | 0 refills | Status: DC | PRN

## 2021-02-20 MED ORDER — METHOCARBAMOL 750 MG PO TABS
750.0000 mg | ORAL_TABLET | Freq: Four times a day (QID) | ORAL | Status: DC | PRN
  Administered 2021-02-20: 750 mg via ORAL
  Filled 2021-02-20: qty 1

## 2021-02-20 NOTE — Discharge Summary (Signed)
Physician Discharge Summary   Patient ID: Adam Daniels MRN: [id number] DOB/AGE: [birth year] 65 y.o.  Admit date: 02/15/2021 Discharge date: 02/20/2021  Primary Care Physician:  [name], MD   Recommendations for Outpatient Follow-up:  1. Follow up with PCP in 1-2 weeks 2. Patient recommended to follow-up IR outpatient if any worsening of the back pain and wishes to have kyphoplasty.   Home Health: Home health PT Equipment/Devices: DME 3 n1  Discharge Condition: stable  CODE STATUS: FULL    Diet recommendation: Soft diet   Discharge Diagnoses:      Acute back pain . Acute compression fracture of L1 lumbar vertebra (HCC) secondary to mechanical fall Severe adynamic ileus . HYPERCHOLESTEROLEMIA . CAD (coronary artery disease)   Consults:  Gastroenterology    Allergies:   Allergies  Allergen Reactions  . Codeine     REACTION: NAUSEA AND VOMITING  . Prednisone     REACTION: WHELPS     DISCHARGE MEDICATIONS: Allergies as of 02/20/2021      Reactions   Codeine    REACTION: NAUSEA AND VOMITING   Prednisone    REACTION: WHELPS      Medication List    STOP taking these medications   cyclobenzaprine 10 MG tablet Commonly known as: FLEXERIL     TAKE these medications   acetaminophen 500 MG tablet Commonly known as: TYLENOL Take 1 tablet (500 mg total) by mouth every 6 (six) hours as needed for mild pain or moderate pain.   Aspirin Low Dose 81 MG EC tablet Generic drug: aspirin Take 81 mg by mouth daily.   atorvastatin 20 MG tablet Commonly known as: LIPITOR Take 20 mg by mouth daily.   clopidogrel 75 MG tablet Commonly known as: PLAVIX Take 75 mg by mouth daily.   fluocinonide ointment 0.05 % Commonly known as: LIDEX Apply 1 application topically 2 (two) times daily as needed for rash.   Hair/Skin/Nails Caps Take 1 tablet by mouth daily.   lidocaine 5 % Commonly known as: LIDODERM Place 1 patch onto the skin daily. Apply to lower  back Start taking on: February 21, 2021   methocarbamol 750 MG tablet Commonly known as: ROBAXIN Take 1 tablet (750 mg total) by mouth every 8 (eight) hours as needed.   polyethylene glycol 17 g packet Commonly known as: MIRALAX / GLYCOLAX Take 17 g by mouth daily as needed for mild constipation or moderate constipation (also available OTC).   traMADol 50 MG tablet Commonly known as: ULTRAM Take 1 tablet (50 mg total) by mouth every 8 (eight) hours as needed for severe pain.            Durable Medical Equipment  (From admission, onward)         Start     Ordered   02/20/21 1037  For home use only DME 3 n 1  Once        02/20/21 1036           Brief H and P: For complete details please refer to admission H and P, but in brief Patient is a 65 year old male with history of CAD, MI status post CABG in 2021, hyperlipidemia, chronic back pain presented after a fall from the ladder at home.  Patient reported that he was climbing up a ladder to cut the tree branches when he fell down on concrete.  He fell on his lower back, denied hitting his head.  Since then patient was having low back pain and pain  on his left side, sharp and stabbing, nonradiating. CT concerning for L1 compression fracture.  Due to uncontrolled pain, patient was admitted for further work-up, PT OT.   Hospital Course:   Acute compression fracture of L1 lumbar vertebra (HCC) - CT L-spine showed acute L1 superior endplate compression fracture with approximately 45% height loss anteriorly.  4 mm retropulsion of the posterior superior endplate.  No additional fractures.  Multilevel disc bulging. - IR evaluation was done for kyphoplasty, procedure can be done outpatient if conservative management fails - PT recommended home health - Continue TLSO brace.    Currently ambulating without any difficulty.   Acute nausea and vomiting with ileus, severe constipation -On 6/4, patient was having intractable nausea and  vomiting.   Abdominal x-ray showed new gaseous distention throughout the redundant large bowel and gas with nondilated small bowel compatible with ileus -X-ray 6/5 showed moderate stool burden, no SBO.    Patient continues to have severe ileus despite multiple stool softeners and laxatives.  CT abdomen pelvis showed no obstruction but still dilated cecum and ascending/transverse/descending colon with air-fluid levels with colonic ileus -Patient was placed on Movantik for reversal of opioid induced severe constipation.  GI was consulted however did not need any colonic decompression as patient had good response with Movantik -He was recommended to continue MiraLAX at home. -Opioid narcotics were discontinued due to severe constipation.  Continue tramadol and Robaxin as needed for pain control.     HYPERCHOLESTEROLEMIA -Continue Lipitor    CAD (coronary artery disease) -Continue aspirin, Plavix, Lipitor -No chest pain or shortness of breath    Day of Discharge S: Stated had large 2 BMs yesterday, feels a whole lot better today.  Wants to go home, ambulating.  BP 124/84 (BP Location: Left Arm)   Pulse 82   Temp 98.5 F (36.9 C) (Oral)   Resp 16   Ht 5\' 7"  (1.702 m)   Wt 81.6 kg   SpO2 94%   BMI 28.19 kg/m   Physical Exam: General: Alert and awake oriented x3 not in any acute distress. HEENT: anicteric sclera, pupils reactive to light and accommodation CVS: S1-S2 clear no murmur rubs or gallops Chest: clear to auscultation bilaterally, no wheezing rales or rhonchi Abdomen: soft nontender, nondistended, normal bowel sounds Extremities: no cyanosis, clubbing or edema noted bilaterally Neuro: no new deficits    Get Medicines reviewed and adjusted: Please take all your medications with you for your next visit with your Primary MD  Please request your Primary MD to go over all hospital tests and procedure/radiological results at the follow up. Please ask your Primary MD to get all  Hospital records sent to his/her office.  If you experience worsening of your admission symptoms, develop shortness of breath, life threatening emergency, suicidal or homicidal thoughts you must seek medical attention immediately by calling 911 or calling your MD immediately  if symptoms less severe.  You must read complete instructions/literature along with all the possible adverse reactions/side effects for all the Medicines you take and that have been prescribed to you. Take any new Medicines after you have completely understood and accept all the possible adverse reactions/side effects.   Do not drive when taking pain medications.   Do not take more than prescribed Pain, Sleep and Anxiety Medications  Special Instructions: If you have smoked or chewed Tobacco  in the last 2 yrs please stop smoking, stop any regular Alcohol  and or any Recreational drug use.  Wear Seat belts while  driving.  Please note  You were cared for by a hospitalist during your hospital stay. Once you are discharged, your primary care physician will handle any further medical issues. Please note that NO REFILLS for any discharge medications will be authorized once you are discharged, as it is imperative that you return to your primary care physician (or establish a relationship with a primary care physician if you do not have one) for your aftercare needs so that they can reassess your need for medications and monitor your lab values.   The results of significant diagnostics from this hospitalization (including imaging, microbiology, ancillary and laboratory) are listed below for reference.      Procedures/Studies:  CT ABDOMEN PELVIS WO CONTRAST  Result Date: 02/19/2021 CLINICAL DATA:  Left lower quadrant abdominal pain, nausea, vomiting and constipation. EXAM: CT ABDOMEN AND PELVIS WITHOUT CONTRAST TECHNIQUE: Multidetector CT imaging of the abdomen and pelvis was performed following the standard protocol without  IV contrast. COMPARISON:  02/15/2021. FINDINGS: Lower chest: Bilateral lower lobe atelectasis. Heart is mildly enlarged. No pericardial or pleural effusion. Distal esophagus is unremarkable. Hepatobiliary: Liver and gallbladder are unremarkable. No biliary ductal dilatation. Pancreas: Negative. Spleen: Negative. Adrenals/Urinary Tract: Adrenal glands and kidneys are unremarkable. Ureters are decompressed. Bladder is grossly unremarkable. Stomach/Bowel: Stomach, small bowel and appendix are unremarkable. Cecum and ascending, transverse and descending colon are dilated and contain air-fluid levels. There is gradual transition to normal caliber rectosigmoid colon. Vascular/Lymphatic: Atherosclerotic calcification of the aorta. No pathologically enlarged lymph nodes. Reproductive: Prostate is mildly enlarged. Other: Small bilateral inguinal hernias contain fat. No free fluid. Mesenteries and peritoneum are unremarkable. Musculoskeletal: Degenerative changes in the spine. No worrisome lytic or sclerotic lesions. L1 compression fracture, described as acute on 02/15/2021. IMPRESSION: 1. Dilated cecum and ascending/transverse/descending colon, with air-fluid levels, findings indicative of a colonic ileus. No evidence of obstruction. 2. Enlarged prostate. 3. L1 compression fracture, described as acute on 02/15/2021. Electronically Signed   By: Lorin Picket M.D.   On: 02/19/2021 12:40   CT ABDOMEN PELVIS W CONTRAST  Result Date: 02/15/2021 CLINICAL DATA:  Severe low back pain after being knocked off a ladder by a tree branch. EXAM: CT ABDOMEN AND PELVIS WITH CONTRAST TECHNIQUE: Multidetector CT imaging of the abdomen and pelvis was performed using the standard protocol following bolus administration of intravenous contrast. CONTRAST:  173mL OMNIPAQUE IOHEXOL 300 MG/ML  SOLN COMPARISON:  None. FINDINGS: Lower chest: No acute abnormality. Bilateral lower lobe dependent subsegmental atelectasis. Hepatobiliary: No focal  liver abnormality is seen. No gallstones, gallbladder wall thickening, or biliary dilatation. Pancreas: Unremarkable. No pancreatic ductal dilatation or surrounding inflammatory changes. Spleen: Normal in size without focal abnormality. Adrenals/Urinary Tract: The adrenal glands are unremarkable. Small bilateral renal cysts. No renal calculi or hydronephrosis. The bladder is unremarkable for the degree of distention. Stomach/Bowel: Stomach is within normal limits. Appendix appears normal. No evidence of bowel wall thickening, distention, or inflammatory changes. Mild sigmoid diverticulosis. Vascular/Lymphatic: Aortic atherosclerosis. No enlarged abdominal or pelvic lymph nodes. Reproductive: Mild prostatomegaly. Other: Small bilateral fat containing inguinal hernias. No free fluid or pneumoperitoneum. Musculoskeletal: Acute L1 superior endplate compression fracture with moderate height loss and mild retropulsion. IMPRESSION: 1. Acute L1 superior endplate compression fracture. 2. Aortic Atherosclerosis (ICD10-I70.0). Electronically Signed   By: Titus Dubin M.D.   On: 02/15/2021 15:11   CT L-SPINE NO CHARGE  Result Date: 02/15/2021 CLINICAL DATA:  Severe lower back pain after being knocked off a ladder by a tree branch. EXAM: CT LUMBAR  SPINE WITH CONTRAST TECHNIQUE: Technique: Multiplanar CT images of the lumbar spine were reconstructed from contemporary CT of the Abdomen and Pelvis. CONTRAST:  No additional COMPARISON:  None. FINDINGS: Segmentation: 5 lumbar type vertebrae. Alignment: Mild levocurvature.  Sagittal alignment is maintained. Vertebrae: Acute L1 superior endplate compression fracture with approximately 45% height loss anteriorly. 4 mm retropulsion of the posterosuperior endplate. No extension into the posterior elements. No additional fracture. Paraspinal and other soft tissues: Please see separate CT abdomen and pelvis report from same day. Disc levels: Mild multilevel disc bulging. No high-grade  spinal canal or neuroforaminal stenosis. IMPRESSION: 1. Acute L1 superior endplate compression fracture as described above. No extension into the posterior elements. Electronically Signed   By: Titus Dubin M.D.   On: 02/15/2021 14:58   DG Chest Portable 1 View  Result Date: 02/15/2021 CLINICAL DATA:  Pain following fall EXAM: PORTABLE CHEST 1 VIEW COMPARISON:  None. FINDINGS: There is slight left base atelectasis. Lungs elsewhere are clear. Heart is upper normal in size with pulmonary vascularity normal. Patient is status post internal mammary bypass grafting. No adenopathy. No evident pneumothorax. No bone lesions. IMPRESSION: Mild left base atelectasis. Lungs otherwise clear. Heart upper normal in size with postoperative changes. No pneumothorax. Electronically Signed   By: Lowella Grip III M.D.   On: 02/15/2021 13:45   DG Abd Portable 1V  Result Date: 02/18/2021 CLINICAL DATA:  Abdominal distension EXAM: PORTABLE ABDOMEN - 1 VIEW COMPARISON:  February 17, 2021 FINDINGS: There is air throughout bowel. Mild dilatation in transverse colon region noted. Moderate stool in colon. No small bowel dilatation or air-fluid levels. No free air evident on supine examination. There is atelectatic change in the left lung base. Small phleboliths in the pelvis. IMPRESSION: Question a degree of colonic ileus. No bowel obstruction evident. No free air evident on supine examination. Left lower lobe region atelectatic change. Electronically Signed   By: Lowella Grip III M.D.   On: 02/18/2021 08:02   DG Abd Portable 1V  Result Date: 02/17/2021 CLINICAL DATA:  Ileus, fall, L1 fracture EXAM: PORTABLE ABDOMEN - 1 VIEW COMPARISON:  Portable exam 1720 hours compared to 0942 hours FINDINGS: Persistent gas throughout colon. Stool in RIGHT colon and proximal transverse colon. Bowel gas pattern appears unchanged. No bowel wall thickening or small bowel distension seen. Bones demineralized. IMPRESSION: Slight gaseous  distention of colon diffusely, question ileus. Electronically Signed   By: Lavonia Dana M.D.   On: 02/17/2021 18:19   DG Abd Portable 1V  Result Date: 02/17/2021 CLINICAL DATA:  65 year old male with intractable nausea vomiting. Status post fall from ladder. L1 compression fracture. EXAM: PORTABLE ABDOMEN - 1 VIEW COMPARISON:  CT Abdomen and Pelvis 02/15/2021 FINDINGS: Portable AP supine view at 0942 hours. New gas distended large bowel, which is redundant. Gas extends to the rectum. Gas-filled but nondilated small bowel in the abdomen. L1 compression fracture redemonstrated. Stable lung bases. IMPRESSION: 1. New gaseous distension throughout the redundant large bowel, and gas-filled nondilated small bowel, compatible with ileus. 2. L1 compression fracture redemonstrated. Electronically Signed   By: Genevie Ann M.D.   On: 02/17/2021 09:53       LAB RESULTS: Basic Metabolic Panel: Recent Labs  Lab 02/15/21 1317 02/15/21 1349 02/18/21 0258 02/19/21 1526  NA 138 139 136  --   K 4.0 4.0 3.9  --   CL 105 105 106  --   CO2 26  --  25  --   GLUCOSE 111* 102* 86  --  BUN 16 18 14   --   CREATININE 0.77 0.60* 0.65  --   CALCIUM 8.9  --  8.3*  --   MG  --   --   --  2.1  PHOS  --   --   --  2.8   Liver Function Tests: Recent Labs  Lab 02/15/21 1317  AST 28  ALT 28  ALKPHOS 81  BILITOT 0.9  PROT 6.4*  ALBUMIN 3.8   No results for input(s): LIPASE, AMYLASE in the last 168 hours. No results for input(s): AMMONIA in the last 168 hours. CBC: Recent Labs  Lab 02/15/21 1317 02/15/21 1349 02/16/21 0145 02/18/21 0258  WBC 12.6*  --  12.2* 9.3  NEUTROABS 10.7*  --   --   --   HGB 15.6   < > 15.2 14.5  HCT 47.2   < > 44.9 43.2  MCV 92.0  --  90.5 91.9  PLT 255  --  266 245   < > = values in this interval not displayed.   Cardiac Enzymes: No results for input(s): CKTOTAL, CKMB, CKMBINDEX, TROPONINI in the last 168 hours. BNP: Invalid input(s): POCBNP CBG: No results for input(s):  GLUCAP in the last 168 hours.     Disposition and Follow-up: Discharge Instructions    Diet - low sodium heart healthy   Complete by: As directed    Increase activity slowly   Complete by: As directed        [place] with home health   DISCHARGE FOLLOW-UP  Follow-up Information    Luanne Bras, MD Follow up.   Specialties: Interventional Radiology, Radiology Why: Please call if you would like to discuss/schedule L1 vertebroplasty/kyphoplasty. Contact information: [place] [address] [phone]        [facility] Follow up.   Specialty: [place] Why: the office will call to schedule home health physical therapy visits Contact information: [place] [address] [phone]        [name], MD. Schedule an appointment as soon as possible for a visit in 2 week(s).   Specialty: Family Medicine Contact information: [place] [address] [phone]                Time coordinating discharge:  35 minutes  Signed:   Estill Cotta M.D. [facility] 02/20/2021, 12:11 PM

## 2021-02-20 NOTE — Progress Notes (Signed)
Discharge instructions (including medications) discussed with and copy provided to patient/caregiver 

## 2021-02-20 NOTE — TOC Transition Note (Signed)
Transition of Care [facility]) - CM/SW Discharge Note   Patient Details  Name: Adam Daniels MRN: [id number] Date of Birth: [birth year]  [facility] CM/SW Contact:  Milinda Antis, [place] Phone Number: 02/20/2021, 12:08 PM   Clinical Narrative:    Patient will DC to: Home with Home health Anticipated DC date: 02/20/2021 Family notified: [facility] Transport by: Wife   Per MD patient ready for DC home with home health.  Patient and spouse had a preference for [facility].  CSW spoke with Stacie at [facility] and they will accept the patient.  The patient's phone number, PCP, and address were verified.  The patient is ready to return home and did not have any questions for CSW.  The wife verified that the patient already has needed DME.  CSW will sign off for now as social work intervention is no longer needed. Please consult [place] again if new needs arise.     Final next level of care: [place] Barriers to Discharge: Barriers Resolved   Patient Goals and CMS Choice Patient states their goals for this hospitalization and ongoing recovery are:: return home with wife CMS Medicare.gov Compare Post Acute Care list provided to:: Patient Choice offered to / list presented to : Patient  Discharge Placement                       Discharge Plan and Services   Discharge Planning Services: NA Post Acute Care Choice: Home Health          DME Arranged: N/A DME Agency: NA       HH Arranged: PT,OT [place] Agency: [place] Date [place]: 02/18/21 Time [place]: [id number] Representative spoke with at [place]: [place] ([place]) Interventions     Readmission Risk Interventions No flowsheet data found.

## 2021-02-20 NOTE — Care Management Important Message (Signed)
Important Message  Patient Details  Name: Adam Daniels MRN: [id number] Date of Birth: 06-23-1956   Medicare Important Message Given:  Yes     [name] [name] 02/20/2021, 1:44 PM

## 2021-02-20 NOTE — Plan of Care (Signed)
  Problem: Education: Goal: Knowledge of General Education information will improve Description: Including pain rating scale, medication(s)/side effects and non-pharmacologic comfort measures Outcome: Adequate for Discharge   

## 2021-02-21 ENCOUNTER — Telehealth: Payer: Self-pay

## 2021-02-21 NOTE — Telephone Encounter (Signed)
Transition Care Management Unsuccessful Follow-up Telephone Call  Date of discharge and from where:  02/20/2021, Adam Daniels   Attempts:  1st Attempt  Reason for unsuccessful TCM follow-up call:  Left voice message

## 2021-02-21 NOTE — Telephone Encounter (Signed)
Transition Care Management Follow-up Telephone Call  Date of discharge and from where: 02/20/2021, Adam Daniels  How have you been since you were released from the hospital? Spoke with wife Adam Daniels, HIPAA verified. Patient is doing okay. Still having some constipation. Resting at home comfortably.  Any questions or concerns? No  Items Reviewed:  Did the pt receive and understand the discharge instructions provided? Yes   Medications obtained and verified? Yes   Other? No   Any new allergies since your discharge? No   Dietary orders reviewed? Yes  Do you have support at home? Yes   Home Care and Equipment/Supplies: Were home health services ordered? yes If so, what is the name of the agency? [facility]  Has the agency set up a time to come to the patient's home? no Were any new equipment or medical supplies ordered?  No What is the name of the medical supply agency? N/A Were you able to get the supplies/equipment? not applicable Do you have any questions related to the use of the equipment or supplies? No  Functional Questionnaire: (I = Independent and D = Dependent) ADLs: I  Bathing/Dressing- I  Meal Prep- I  Eating- I  Maintaining continence- I  Transferring/Ambulation- I  Managing Meds- I  Follow up appointments reviewed:   PCP Hospital f/u appt confirmed? No   Adam Daniels stated that she will call the office back tomorrow to schedule a follow up visit  Specialist Hospital f/u appt confirmed? follow up with Dr. Estanislado Pandy   Are transportation arrangements needed? No   If their condition worsens, is the pt aware to call PCP or go to the Emergency Dept.? Yes  Was the patient provided with contact information for the PCP's office or ED? Yes  Was to pt encouraged to call back with questions or concerns? Yes

## 2021-02-26 ENCOUNTER — Telehealth: Payer: Self-pay | Admitting: Family Medicine

## 2021-02-26 NOTE — Telephone Encounter (Signed)
Home Health verbal orders-caller/Agency: [place] number:  [phone]  Requesting OT/PT/Skilled nursing/Social Work/Speech: PT  Reason: L1 COMPRESSION FRACTURE  Frequency: 1W1 2W3  STARTING 02/23/21

## 2021-02-26 NOTE — Telephone Encounter (Signed)
Agree with this. Thank you.  

## 2021-02-26 NOTE — Telephone Encounter (Signed)
Spoke with Elta Gu[place] informing him  Dr. Darnell Level is giving verbal orders for services requested.

## 2021-03-05 ENCOUNTER — Other Ambulatory Visit: Payer: Self-pay

## 2021-03-05 ENCOUNTER — Ambulatory Visit: Payer: Medicare PPO | Admitting: Family Medicine

## 2021-03-05 ENCOUNTER — Encounter: Payer: Self-pay | Admitting: Family Medicine

## 2021-03-05 VITALS — BP 126/80 | HR 80 | Temp 97.5°F | Ht 67.0 in | Wt 180.4 lb

## 2021-03-05 DIAGNOSIS — Z6827 Body mass index (BMI) 27.0-27.9, adult: Secondary | ICD-10-CM

## 2021-03-05 DIAGNOSIS — K644 Residual hemorrhoidal skin tags: Secondary | ICD-10-CM

## 2021-03-05 DIAGNOSIS — Z7902 Long term (current) use of antithrombotics/antiplatelets: Secondary | ICD-10-CM

## 2021-03-05 DIAGNOSIS — K567 Ileus, unspecified: Secondary | ICD-10-CM

## 2021-03-05 DIAGNOSIS — I2510 Atherosclerotic heart disease of native coronary artery without angina pectoris: Secondary | ICD-10-CM

## 2021-03-05 DIAGNOSIS — Z86010 Personal history of colonic polyps: Secondary | ICD-10-CM

## 2021-03-05 DIAGNOSIS — I700 Atherosclerosis of aorta: Secondary | ICD-10-CM

## 2021-03-05 DIAGNOSIS — S32010A Wedge compression fracture of first lumbar vertebra, initial encounter for closed fracture: Secondary | ICD-10-CM | POA: Diagnosis not present

## 2021-03-05 DIAGNOSIS — I252 Old myocardial infarction: Secondary | ICD-10-CM

## 2021-03-05 DIAGNOSIS — E663 Overweight: Secondary | ICD-10-CM

## 2021-03-05 DIAGNOSIS — J309 Allergic rhinitis, unspecified: Secondary | ICD-10-CM

## 2021-03-05 DIAGNOSIS — Z87891 Personal history of nicotine dependence: Secondary | ICD-10-CM

## 2021-03-05 DIAGNOSIS — Z9181 History of falling: Secondary | ICD-10-CM

## 2021-03-05 DIAGNOSIS — E7800 Pure hypercholesterolemia, unspecified: Secondary | ICD-10-CM

## 2021-03-05 DIAGNOSIS — G8929 Other chronic pain: Secondary | ICD-10-CM

## 2021-03-05 MED ORDER — TRAMADOL HCL 50 MG PO TABS: 50.0000 mg | ORAL_TABLET | Freq: Three times a day (TID) | ORAL | 0 refills | Status: DC | PRN

## 2021-03-05 MED ORDER — METHOCARBAMOL 750 MG PO TABS: 750.0000 mg | ORAL_TABLET | Freq: Three times a day (TID) | ORAL | 0 refills | Status: DC | PRN

## 2021-03-05 NOTE — Patient Instructions (Addendum)
Start vitamin D3 1000 units daily  Continue current medicines. Ok to continue robaxin muscle relaxant at night with sparing tramadol use.  Let us know if not improving each day.  Handicap placard application filled out today

## 2021-03-05 NOTE — Progress Notes (Signed)
Patient ID: Adam Daniels, male    DOB: Aug 17, 1956, 65 y.o.   MRN: [id number]  This visit was conducted in person.  BP 126/80   Pulse 80   Temp (!) 97.5 F (36.4 C) (Temporal)   Ht 5\' 7"  (1.702 m)   Wt 180 lb 6 oz (81.8 kg)   SpO2 96%   BMI 28.25 kg/m    CC: hospital f/u visit  Subjective:   HPI: Adam Daniels is a 65 y.o. male presenting on 03/05/2021 for Hospitalization Follow-up (Admitted on 02/15/21 to [facility], dx lumbar back sprain; fall.  Pt accompanied by wife, [name] temp 97.6.)   Recent hospitalization after fall from 44ft ladder at home with intractable lower back pain, records reviewed. CT showed acute traumatic L1 superior endplate compression fracture with approx 45% height loss anteriorly and 30mm retropulsion of posterior endplate. IR evaluated - to consider kyphoplasty if fails conservative management. Rec TLSO brace and pain control. Discharged with HHPT.   He did develop nausea/vomiting due to ileus while hospitalized, treated with IV fluids and antinausea medications as well as smog enema. GI evaluation - stopped flexeril and lactulose, treated with movantik, senokot-S, miralax in setting of tramadol and robaxin use. Lidocaine patches didn't help. He had previously also received fentanyl and dilaudid for pain.   HHPT planned with [facility].  He feels pain is slowly improving.  Last BM this morning, normal.  Currently not taking anything for bowels.  Currently taking tramadol and robaxin at night time. Trying to wean off. He feels muscle relaxants help the most.   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 I don't have records of this.    Admit date: 02/15/2021 Discharge date: 02/20/2021 TCM hosp f/u phone call completed on 02/21/2021.   Recommendations for Outpatient Follow-up:  Follow up with PCP in 1-2 weeks Patient recommended to follow-up IR outpatient if any worsening of the back pain and wishes to have  kyphoplasty.   Home Health: Home health PT Equipment/Devices: DME 3 n1   Discharge Condition: stable CODE STATUS: FULL    Diet recommendation: Soft diet   Discharge Diagnoses:     Acute back pain  Acute compression fracture of L1 lumbar vertebra (HCC) secondary to mechanical fall Severe adynamic ileus  HYPERCHOLESTEROLEMIA  CAD (coronary artery disease)   Consults:  Gastroenterology     Relevant past medical, surgical, family and social history reviewed and updated as indicated. Interim medical history since our last visit reviewed. Allergies and medications reviewed and updated. Outpatient Medications Prior to Visit  Medication Sig Dispense Refill   acetaminophen (TYLENOL) 500 MG tablet Take 1 tablet (500 mg total) by mouth every 6 (six) hours as needed for mild pain or moderate pain.     ASPIRIN LOW DOSE 81 MG EC tablet Take 81 mg by mouth daily.     atorvastatin (LIPITOR) 40 MG tablet Take 1 tablet (40 mg total) by mouth daily.     clopidogrel (PLAVIX) 75 MG tablet Take 75 mg by mouth daily.     fluocinonide ointment (LIDEX) 0.97 % Apply 1 application topically 2 (two) times daily as needed for rash.     lidocaine (LIDODERM) 5 % Place 1 patch onto the skin daily. Apply to lower back 30 patch 0   Multiple Vitamins-Minerals (HAIR/SKIN/NAILS) CAPS Take 1 tablet by mouth daily.     atorvastatin (LIPITOR) 20 MG tablet Take 20 mg by mouth daily.  methocarbamol (ROBAXIN) 750 MG tablet Take 1 tablet (750 mg total) by mouth every 8 (eight) hours as needed. 30 tablet 0   traMADol (ULTRAM) 50 MG tablet Take 1 tablet (50 mg total) by mouth every 8 (eight) hours as needed for severe pain. 30 tablet 0   polyethylene glycol (MIRALAX / GLYCOLAX) 17 g packet Take 17 g by mouth daily as needed for mild constipation or moderate constipation (also available OTC). 30 each 0   No facility-administered medications prior to visit.     Per HPI unless specifically indicated in ROS section  below Review of Systems Objective:  BP 126/80   Pulse 80   Temp (!) 97.5 F (36.4 C) (Temporal)   Ht 5\' 7"  (1.702 m)   Wt 180 lb 6 oz (81.8 kg)   SpO2 96%   BMI 28.25 kg/m   Wt Readings from Last 3 Encounters:  03/05/21 180 lb 6 oz (81.8 kg)  02/15/21 180 lb (81.6 kg)  04/27/20 195 lb (88.5 kg)      Physical Exam Vitals and nursing note reviewed.  Constitutional:      Appearance: Normal appearance. He is not ill-appearing.     Comments:  Walking with cane Stiff movements  Able to get on exam table   Cardiovascular:     Rate and Rhythm: Normal rate and regular rhythm.     Pulses: Normal pulses.     Heart sounds: Normal heart sounds. No murmur heard. Pulmonary:     Effort: Pulmonary effort is normal. No respiratory distress.     Breath sounds: Normal breath sounds. No wheezing, rhonchi or rales.  Musculoskeletal:     Right lower leg: No edema.     Left lower leg: No edema.     Comments:  Wearing TSLO brace  No midline thoracic or lumbar back pain to palpation   Skin:    General: Skin is warm and dry.     Findings: No rash.  Neurological:     Mental Status: He is alert.      Assessment & Plan:  This visit occurred during the SARS-CoV-2 public health emergency.  Safety protocols were in place, including screening questions prior to the visit, additional usage of staff PPE, and extensive cleaning of exam room while observing appropriate contact time as indicated for disinfecting solutions.   Problem List Items Addressed This Visit     CAD (coronary artery disease)    Suffered MI at [facility] 05/2020. Now on atorvastatin 40mg  along with aspirin/plavix. Follows with cardiology in [place].  Will need to request records.        Relevant Medications   atorvastatin (LIPITOR) 40 MG tablet   Traumatic compression fracture of L1 lumbar vertebra [facility]) [facility] records reviewed. Slowly improving on tramadol/methocarbamol. Reviewed dosing. Continues  TLSO brace for now as well. Continue HHPT through [place].  Lidocaine patches didn't help.  Handicap placard filled out today.        Ileus ([place])    Medication related - states he received fentanyl and dilaudid. Doing better since home with regular normal bowel movements. Discussed tramadol/robaxin use.  No longer on movantik.          Meds ordered this encounter  Medications   methocarbamol (ROBAXIN) 750 MG tablet    Sig: Take 1 tablet (750 mg total) by mouth every 8 (eight) hours as needed.    Dispense:  40 tablet    Refill:  0  traMADol (ULTRAM) 50 MG tablet    Sig: Take 1 tablet (50 mg total) by mouth every 8 (eight) hours as needed for severe pain.    Dispense:  20 tablet    Refill:  0   No orders of the defined types were placed in this encounter.   Patient Instructions  Start vitamin D3 1000 units daily  Continue current medicines. Ok to continue robaxin muscle relaxant at night with sparing tramadol use.  Let us know if not improving each day.  Handicap placard application filled out today    Follow up plan: Return if symptoms worsen or fail to improve.  [name], MD

## 2021-03-06 DIAGNOSIS — K567 Ileus, unspecified: Secondary | ICD-10-CM | POA: Insufficient documentation

## 2021-03-06 NOTE — Assessment & Plan Note (Addendum)
Suffered MI at [facility] 05/2020. Now on atorvastatin 40mg  along with aspirin/plavix. Follows with cardiology in [place].  Will need to request records.

## 2021-03-06 NOTE — Assessment & Plan Note (Addendum)
Hospital records reviewed. Slowly improving on tramadol/methocarbamol. Reviewed dosing. Continues TLSO brace for now as well. Continue HHPT through [place].  Lidocaine patches didn't help.  Handicap placard filled out today.

## 2021-03-06 NOTE — Addendum Note (Signed)
Addended by: [name] on: 03/06/2021 07:35 AM   Modules accepted: Orders

## 2021-03-06 NOTE — Assessment & Plan Note (Addendum)
Medication related - states he received fentanyl and dilaudid. Doing better since home with regular normal bowel movements. Discussed tramadol/robaxin use.  No longer on movantik.

## 2021-03-15 ENCOUNTER — Telehealth: Payer: Self-pay | Admitting: Family Medicine

## 2021-03-15 NOTE — Telephone Encounter (Signed)
Pt received summons for jury duty. 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 Please advise.

## 2021-03-20 NOTE — Telephone Encounter (Signed)
Adam Daniels called in wanted to check on the letter due to they do not have a lot of time left before it needs to be submitted.

## 2021-03-20 NOTE — Telephone Encounter (Signed)
Jury duty letter written and in Lisa's box.  

## 2021-03-21 NOTE — Telephone Encounter (Signed)
Lvm asking pt to call back.  Need to notify pt the jury letter is ready to pick up.  [Placed letter at front office- yellow folders.]

## 2021-03-21 NOTE — Telephone Encounter (Signed)
Patient advised.

## 2021-03-30 ENCOUNTER — Other Ambulatory Visit: Payer: Self-pay | Admitting: Orthopedic Surgery

## 2021-04-02 ENCOUNTER — Other Ambulatory Visit
Admission: RE | Admit: 2021-04-02 | Discharge: 2021-04-02 | Disposition: A | Discharge status: Home / Self Care | Payer: Medicare PPO | Source: Ambulatory Visit | Attending: Orthopedic Surgery | Admitting: Orthopedic Surgery

## 2021-04-02 ENCOUNTER — Other Ambulatory Visit: Payer: Self-pay

## 2021-04-02 HISTORY — DX: Atherosclerotic heart disease of native coronary artery without angina pectoris: I25.10

## 2021-04-02 MED ORDER — CEFAZOLIN SODIUM-DEXTROSE 2-4 GM/100ML-% IV SOLN
2.0000 g | INTRAVENOUS | Status: AC
  Administered 2021-04-03: 2 g via INTRAVENOUS

## 2021-04-02 MED ORDER — LACTATED RINGERS IV SOLN: INTRAVENOUS | Status: DC

## 2021-04-02 MED ORDER — ORAL CARE MOUTH RINSE: 15.0000 mL | Freq: Once | OROMUCOSAL | Status: AC

## 2021-04-02 MED ORDER — FAMOTIDINE 20 MG PO TABS: 20.0000 mg | ORAL_TABLET | Freq: Once | ORAL | Status: AC

## 2021-04-02 MED ORDER — CHLORHEXIDINE GLUCONATE 0.12 % MT SOLN: 15.0000 mL | Freq: Once | OROMUCOSAL | Status: AC

## 2021-04-02 NOTE — Patient Instructions (Addendum)
Your procedure is scheduled on: Tuesday April 03, 2021. Report to Day Surgery inside [facility] 2nd floor (stop by admissions desk before getting on elevator). To find out your arrival time please call [phone] between 1PM - 3PM on Monday April 02, 2021.  Remember: Instructions that are not followed completely may result in serious medical risk,  up to and including death, or upon the discretion of your surgeon and anesthesiologist your  surgery may need to be rescheduled.     _X__ 1. Do not eat food after midnight the night before your procedure.                 No chewing gum or hard candies. You may drink clear liquids up to 2 hours                 before you are scheduled to arrive for your surgery- DO not drink clear                 liquids within 2 hours of the start of your surgery.                 Clear Liquids include:  water, apple juice without pulp, clear Gatorade, G2 or                  Gatorade Zero (avoid Red/Purple/Blue), Black Coffee or Tea (Do not add                 anything to coffee or tea).  __X__2.   Complete the "Ensure Clear Pre-surgery Clear Carbohydrate Drink" provided to you, 2 hours before arrival. **If you are diabetic you will be provided with an alternative drink, Gatorade Zero or G2.  __X__3.  On the morning of surgery brush your teeth with toothpaste and water, you                may rinse your mouth with mouthwash if you wish.  Do not swallow any toothpaste of mouthwash.     _X__ 4.  No Alcohol for 24 hours before or after surgery.   _X__ 5.  Do Not Smoke or use e-cigarettes For 24 Hours Prior to Your Surgery.                 Do not use any chewable tobacco products for at least 6 hours prior to                 Surgery.  _X__  6.  Do not use any recreational drugs (marijuana, cocaine, heroin, ecstasy, MDMA or other)                For at least one week prior to your surgery.  Combination of these drugs with anesthesia                 May have life threatening results.  __X__  7.  Notify your doctor if there is any change in your medical condition      (cold, fever, infections).     Do not wear jewelry, make-up, hairpins, clips or nail polish. Do not wear lotions, powders, or perfumes. You may wear deodorant. Do not shave 48 hours prior to surgery. Men may shave face and neck. Do not bring valuables to the hospital.    [facility] is not responsible for any belongings or valuables.  Contacts, dentures or bridgework may not be worn into surgery. Leave your suitcase in the car. After surgery  it may be brought to your room. For patients admitted to the hospital, discharge time is determined by your treatment team.   Patients discharged the day of surgery will not be allowed to drive home.   Make arrangements for someone to be with you for the first 24 hours of your Same Day Discharge.   __X__ Take these medicines the morning of surgery with A SIP OF WATER:    1. None    2.   3.   4.  5.  6.  ____ Fleet Enema (as directed)   __X__ Use CHG Soap (or wipes) as directed  ____ Use Benzoyl Peroxide Gel as instructed  ____ Use inhalers on the day of surgery  ____ Stop metformin 2 days prior to surgery    ____ Take 1/2 of usual insulin dose the night before surgery. No insulin the morning          of surgery.   __X__ Dennis Bast already stopped clopidogrel (PLAVIX) 75 MG as instructed by your doctor.   __X__ One Week prior to surgery- Stop Anti-inflammatories such as Ibuprofen, Aleve, Advil, Motrin, meloxicam (MOBIC), diclofenac, etodolac, ketorolac, Toradol, Daypro, piroxicam, Goody's or BC powders. OK TO USE TYLENOL IF NEEDED   __X__ Stop all supplements until after surgery.    ____ Bring C-Pap to the hospital.    If you have any questions regarding your pre-procedure instructions,  Please call Pre-admit Testing at [phone]

## 2021-04-03 ENCOUNTER — Ambulatory Visit
Admission: RE | Admit: 2021-04-03 | Discharge: 2021-04-03 | Disposition: A | Discharge status: Home / Self Care | Payer: Medicare PPO | Attending: Orthopedic Surgery | Admitting: Orthopedic Surgery

## 2021-04-03 ENCOUNTER — Encounter: Payer: Self-pay | Admitting: Orthopedic Surgery

## 2021-04-03 ENCOUNTER — Encounter
Admission: RE | Disposition: A | Discharge status: Home / Self Care | Payer: Self-pay | Source: Home / Self Care | Attending: Orthopedic Surgery

## 2021-04-03 ENCOUNTER — Ambulatory Visit: Payer: Medicare PPO | Admitting: Anesthesiology

## 2021-04-03 ENCOUNTER — Ambulatory Visit: Payer: Medicare PPO

## 2021-04-03 ENCOUNTER — Other Ambulatory Visit: Payer: Self-pay

## 2021-04-03 DIAGNOSIS — Y733 Surgical instruments, materials and gastroenterology and urology devices (including sutures) associated with adverse incidents: Secondary | ICD-10-CM

## 2021-04-03 DIAGNOSIS — Z7982 Long term (current) use of aspirin: Secondary | ICD-10-CM | POA: Insufficient documentation

## 2021-04-03 DIAGNOSIS — W11XXXA Fall on and from ladder, initial encounter: Secondary | ICD-10-CM | POA: Insufficient documentation

## 2021-04-03 DIAGNOSIS — Z419 Encounter for procedure for purposes other than remedying health state, unspecified: Secondary | ICD-10-CM

## 2021-04-03 DIAGNOSIS — Z885 Allergy status to narcotic agent status: Secondary | ICD-10-CM | POA: Insufficient documentation

## 2021-04-03 DIAGNOSIS — S32010A Wedge compression fracture of first lumbar vertebra, initial encounter for closed fracture: Secondary | ICD-10-CM | POA: Insufficient documentation

## 2021-04-03 DIAGNOSIS — Z7902 Long term (current) use of antithrombotics/antiplatelets: Secondary | ICD-10-CM | POA: Insufficient documentation

## 2021-04-03 DIAGNOSIS — Z79899 Other long term (current) drug therapy: Secondary | ICD-10-CM | POA: Diagnosis not present

## 2021-04-03 HISTORY — PX: KYPHOPLASTY: SHX5884

## 2021-04-03 SURGERY — KYPHOPLASTY
Anesthesia: General

## 2021-04-03 MED ORDER — BUPIVACAINE-EPINEPHRINE (PF) 0.5% -1:200000 IJ SOLN
INTRAMUSCULAR | Status: DC | PRN
  Administered 2021-04-03: 25 mL via PERINEURAL

## 2021-04-03 MED ORDER — LIDOCAINE HCL (PF) 1 % IJ SOLN
INTRAMUSCULAR | Status: AC
  Filled 2021-04-03: qty 30

## 2021-04-03 MED ORDER — IOHEXOL 180 MG/ML  SOLN
INTRAMUSCULAR | Status: DC | PRN
  Administered 2021-04-03: 10 mL

## 2021-04-03 MED ORDER — LIDOCAINE HCL 1 % IJ SOLN
INTRAMUSCULAR | Status: DC | PRN
  Administered 2021-04-03: 25 mL

## 2021-04-03 MED ORDER — FENTANYL CITRATE (PF) 100 MCG/2ML IJ SOLN: 25.0000 ug | INTRAMUSCULAR | Status: DC | PRN

## 2021-04-03 MED ORDER — BUPIVACAINE-EPINEPHRINE (PF) 0.5% -1:200000 IJ SOLN
INTRAMUSCULAR | Status: AC
  Filled 2021-04-03: qty 30

## 2021-04-03 MED ORDER — FENTANYL CITRATE (PF) 100 MCG/2ML IJ SOLN
INTRAMUSCULAR | Status: AC
  Filled 2021-04-03: qty 2

## 2021-04-03 MED ORDER — CEFAZOLIN SODIUM-DEXTROSE 2-4 GM/100ML-% IV SOLN
INTRAVENOUS | Status: AC
  Filled 2021-04-03: qty 100

## 2021-04-03 MED ORDER — ONDANSETRON HCL 4 MG/2ML IJ SOLN: 4.0000 mg | Freq: Once | INTRAMUSCULAR | Status: DC | PRN

## 2021-04-03 MED ORDER — FAMOTIDINE 20 MG PO TABS
ORAL_TABLET | ORAL | Status: AC
  Administered 2021-04-03: 20 mg via ORAL
  Filled 2021-04-03: qty 1

## 2021-04-03 MED ORDER — 0.9 % SODIUM CHLORIDE (POUR BTL) OPTIME
TOPICAL | Status: DC | PRN
  Administered 2021-04-03: 20 mL

## 2021-04-03 MED ORDER — KETAMINE HCL 10 MG/ML IJ SOLN
INTRAMUSCULAR | Status: DC | PRN
  Administered 2021-04-03: 10 mg via INTRAVENOUS
  Administered 2021-04-03: 5 mg via INTRAVENOUS
  Administered 2021-04-03: 10 mg via INTRAVENOUS

## 2021-04-03 MED ORDER — MIDAZOLAM HCL 2 MG/2ML IJ SOLN
INTRAMUSCULAR | Status: AC
  Filled 2021-04-03: qty 2

## 2021-04-03 MED ORDER — CHLORHEXIDINE GLUCONATE 0.12 % MT SOLN
OROMUCOSAL | Status: AC
  Administered 2021-04-03: 15 mL via OROMUCOSAL
  Filled 2021-04-03: qty 15

## 2021-04-03 MED ORDER — FENTANYL CITRATE (PF) 100 MCG/2ML IJ SOLN
INTRAMUSCULAR | Status: DC | PRN
  Administered 2021-04-03 (×2): 50 ug via INTRAVENOUS

## 2021-04-03 MED ORDER — MIDAZOLAM HCL 2 MG/2ML IJ SOLN
INTRAMUSCULAR | Status: DC | PRN
  Administered 2021-04-03 (×2): 1 mg via INTRAVENOUS

## 2021-04-03 MED ORDER — KETAMINE HCL 50 MG/5ML IJ SOSY
PREFILLED_SYRINGE | INTRAMUSCULAR | Status: AC
  Filled 2021-04-03: qty 5

## 2021-04-03 SURGICAL SUPPLY — 21 items
ADH SKN CLS APL DERMABOND .7 (GAUZE/BANDAGES/DRESSINGS) ×1
CEMENT KYPHON CX01A KIT/MIXER (Cement) ×2 IMPLANT
DERMABOND ADVANCED (GAUZE/BANDAGES/DRESSINGS) ×1
DERMABOND ADVANCED .7 DNX12 (GAUZE/BANDAGES/DRESSINGS) ×1 IMPLANT
DEVICE BIOPSY BONE KYPHX (INSTRUMENTS) ×3 IMPLANT
DRAPE C-ARM XRAY 36X54 (DRAPES) ×2 IMPLANT
DURAPREP 26ML APPLICATOR (WOUND CARE) ×2 IMPLANT
FEE RENTAL RFA GENERATOR (MISCELLANEOUS) IMPLANT
GAUZE 4X4 16PLY ~~LOC~~+RFID DBL (SPONGE) ×2 IMPLANT
GLOVE SURG SYN 9.0  PF PI (GLOVE) ×2
GLOVE SURG SYN 9.0 PF PI (GLOVE) ×1 IMPLANT
GOWN SRG 2XL LVL 4 RGLN SLV (GOWNS) ×1 IMPLANT
GOWN STRL NON-REIN 2XL LVL4 (GOWNS) ×2
GOWN STRL REUS W/ TWL LRG LVL3 (GOWN DISPOSABLE) ×1 IMPLANT
GOWN STRL REUS W/TWL LRG LVL3 (GOWN DISPOSABLE) ×2
PACK KYPHOPLASTY (MISCELLANEOUS) ×2 IMPLANT
RENTAL RFA GENERATOR (MISCELLANEOUS) IMPLANT
STRAP SAFETY 5IN WIDE (MISCELLANEOUS) ×2 IMPLANT
SWABSTK COMLB BENZOIN TINCTURE (MISCELLANEOUS) ×1 IMPLANT
TRAY KYPHOPAK 15/3 EXPRESS 1ST (MISCELLANEOUS) ×3 IMPLANT
TRAY KYPHOPAK 20/3 EXPRESS 1ST (MISCELLANEOUS) ×2 IMPLANT

## 2021-04-03 NOTE — Discharge Instructions (Addendum)
Take it easy tonight. Resume all your regular medications. Remove Band-Aids on Thursday then okay to shower Try to walk all you can. Call office if having problems   AMBULATORY SURGERY  DISCHARGE INSTRUCTIONS   The drugs that you were given will stay in your system until tomorrow so for the next 24 hours you should not:  Drive an automobile Make any legal decisions Drink any alcoholic beverage   You may resume regular meals tomorrow.  Today it is better to start with liquids and gradually work up to solid foods.  You may eat anything you prefer, but it is better to start with liquids, then soup and crackers, and gradually work up to solid foods.   Please notify your doctor immediately if you have any unusual bleeding, trouble breathing, redness and pain at the surgery site, drainage, fever, or pain not relieved by medication.    Your post-operative visit with Dr.                                       is: Date:                        Time:    Please call to schedule your post-operative visit.  Additional Instructions:

## 2021-04-03 NOTE — Op Note (Signed)
04/03/2021  8:03 PM  PATIENT:  Adam Daniels   MRN: [id number]   PRE-OPERATIVE DIAGNOSIS:  closed wedge compression fracture of L1   POST-OPERATIVE DIAGNOSIS:  closed wedge compression fracture of L1   PROCEDURE:  Procedure(s): KYPHOPLASTY L1  SURGEON: Laurene Footman, MD   ASSISTANTS: None   ANESTHESIA:   local and MAC   EBL:  No intake/output data recorded.   BLOOD ADMINISTERED:none   DRAINS: none    LOCAL MEDICATIONS USED:  MARCAINE    and XYLOCAINE    SPECIMEN: L1 vertebral body biopsy   DISPOSITION OF SPECIMEN: Pathology   COUNTS:  YES   TOURNIQUET:  * No tourniquets in log *   IMPLANTS: Bone cement   DICTATION: .Dragon Dictation  patient was brought to the operating room and after adequate anesthesia was obtained the patient was placed prone.  C arm was brought in in good visualization of the affected level obtained on both AP and lateral projections.  After patient identification and timeout procedures were completed, local anesthetic was infiltrated with 10 cc 1% Xylocaine infiltrated subcutaneously.  This is done the area on the each side of the planned approach.  The back was then prepped and draped in the usual sterile manner and repeat timeout procedure carried out.  A spinal needle was brought down to the pedicle on the each side of L1 and a 50-50 mix of 1% Xylocaine half percent Sensorcaine with epinephrine total of 20 cc injected on each side.  After allowing this to set a small incision was made and the trocar was advanced into the vertebral body in an extrapedicular fashion.  Biopsy was obtained drilling was carried out balloon inserted with inflation to 1.5 cc on the right and 2.0 cc on the left.  When the cement was appropriate consistency 2 cc were injected on the right and 2.5 cc on the left into the vertebral body without extravasation, good fill superior to inferior endplates and from right to left sides along the inferior endplate.  After the cement had  set the trochar was removed and permanent C-arm views obtained.  The wound was closed with Dermabond followed by Band-Aid   PLAN OF CARE: Discharge home after recovery room   PATIENT DISPOSITION:  PACU - hemodynamically stable.

## 2021-04-03 NOTE — Anesthesia Postprocedure Evaluation (Signed)
Anesthesia Post Note  Patient: Adam Daniels  Procedure(s) Performed: L 1  KYPHOPLASTY  Patient location during evaluation: PACU Anesthesia Type: General Level of consciousness: awake and alert Pain management: pain level controlled Vital Signs Assessment: post-procedure vital signs reviewed and stable Respiratory status: spontaneous breathing, nonlabored ventilation, respiratory function stable and patient connected to nasal cannula oxygen Cardiovascular status: blood pressure returned to baseline and stable Postop Assessment: no apparent nausea or vomiting Anesthetic complications: no   No notable events documented.   Last Vitals:  Vitals:   04/03/21 2015 04/03/21 2030  BP: (!) 143/93 138/86  Pulse: 78   Resp: 15   Temp:  (!) 36.4 C  SpO2: 98%     Last Pain:  Vitals:   04/03/21 2032  TempSrc:   PainSc: 0-No pain                 Martha Clan

## 2021-04-03 NOTE — Anesthesia Preprocedure Evaluation (Signed)
Anesthesia Evaluation  Patient identified by MRN, date of birth, ID band Patient awake    Reviewed: Allergy & Precautions, NPO status , Patient's Chart, lab work & pertinent test results  History of Anesthesia Complications Negative for: history of anesthetic complications  Airway Mallampati: II  TM Distance: >3 FB Neck ROM: Full    Dental no notable dental hx.    Pulmonary neg sleep apnea, neg COPD, former smoker,    breath sounds clear to auscultation- rhonchi (-) wheezing      Cardiovascular Exercise Tolerance: Good (-) hypertension+ CAD and + CABG   Rhythm:Regular Rate:Normal - Systolic murmurs and - Diastolic murmurs    Neuro/Psych neg Seizures negative neurological ROS  negative psych ROS   GI/Hepatic negative GI ROS, Neg liver ROS,   Endo/Other  negative endocrine ROSneg diabetes  Renal/GU negative Renal ROS     Musculoskeletal negative musculoskeletal ROS (+)   Abdominal (+) - obese,   Peds  Hematology negative hematology ROS (+)   Anesthesia Other Findings Past Medical History: 04/05/2016: Alcohol use 09/2005: Allergic rhinitis No date: Colon polyp No date: Coronary artery disease No date: Diverticulosis     Comment:  by colonsocpy No date: HLD (hyperlipidemia) 02/2012: Nipple dermatitis     Comment:  trial of clobetasol  [name]) 02/07/2012: Nipple lesion   Reproductive/Obstetrics                             Anesthesia Physical Anesthesia Plan  ASA: 3  Anesthesia Plan: General   Post-op Pain Management:    Induction: Intravenous  PONV Risk Score and Plan: 1 and Propofol infusion  Airway Management Planned: Natural Airway  Additional Equipment:   Intra-op Plan:   Post-operative Plan:   Informed Consent: I have reviewed the patients History and Physical, chart, labs and discussed the procedure including the risks, benefits and alternatives for the proposed  anesthesia with the patient or authorized representative who has indicated his/her understanding and acceptance.     Dental advisory given  Plan Discussed with: CRNA and Anesthesiologist  Anesthesia Plan Comments:         Anesthesia Quick Evaluation

## 2021-04-03 NOTE — Transfer of Care (Signed)
Immediate Anesthesia Transfer of Care Note  Patient: Adam Daniels  Procedure(s) Performed: L 1  KYPHOPLASTY  Patient Location: PACU  Anesthesia Type:MAC  Level of Consciousness: awake, alert , oriented and patient cooperative  Airway & Oxygen Therapy: Patient Spontanous Breathing  Post-op Assessment: Report given to RN and Post -op Vital signs reviewed and stable  Post vital signs: Reviewed and stable  Last Vitals:  Vitals Value Taken Time  BP 139/91 04/03/21 2005  Temp 36.4 C 04/03/21 2005  Pulse 74 04/03/21 2010  Resp 10 04/03/21 2010  SpO2 96 % 04/03/21 2010  Vitals shown include unvalidated device data.  Last Pain:  Vitals:   04/03/21 2005  TempSrc:   PainSc: 0-No pain         Complications: No notable events documented.

## 2021-04-03 NOTE — H&P (Signed)
Chief Complaint  Patient presents with   Lower Back - Pain    History of the Present Illness: Adam Daniels is a 65 y.o. male here today.   The patient presents for evaluation of low back pain. The patient suffered a fall of a ladder while cutting trees 6 weeks ago. He has a significant compression fracture noted on CT scan on 02/19/2021. He had a lumbar spine x-ray here in 03/2019, with no fractures noted.  The patient locates his pain to the L2 area. He states he has a brace, and his primary care physician told him to try to wean off of it every once in a while due to muscle weakness. The patient states he has injured his back several times in the past. He has not been taking any medication for pain. He ordered a figure-8 brace to help straighten his back.  The patient takes Plavix and aspirin. He started his Plavix in 05/2020 for heart bypass surgery. He presents with a male companion who states the patient has had 5 bypass surgeries. His cardiologist is Chase Picket MD at [facility]..  I have reviewed past medical, surgical, social and family history, and allergies as documented in the EMR.  Past Medical History: No past medical history on file.  Past Surgical History: No past surgical history on file.  Past Family History: No family history on file.  Medications: Current Outpatient Medications Ordered in Epic  Medication Sig Dispense Refill   acetaminophen (TYLENOL) 500 MG tablet Take 1 tablet by mouth every 6 (six) hours as needed   aspirin 81 MG EC tablet Take 1 tablet by mouth once daily   atorvastatin (LIPITOR) 40 MG tablet Take 1 tablet by mouth once daily   clopidogreL (PLAVIX) 75 mg tablet Take 1 tablet by mouth once daily   methocarbamoL (ROBAXIN) 750 MG tablet Take 1 tablet by mouth every 8 (eight) hours as needed   traMADoL (ULTRAM) 50 mg tablet Take 1 tablet by mouth every 8 (eight) hours as needed   etodolac (LODINE) 500 MG tablet TAKE 1 TABLET BY MOUTH  TWICE A DAY 60 tablet 0   No current Epic-ordered facility-administered medications on file.   Allergies: Allergies  Allergen Reactions   Codeine Nausea  REACTION: NAUSEA AND VOMITING   Prednisone Other (See Comments)  REACTION: WHELPS    Body mass index is 27.94 kg/m.  Review of Systems: A comprehensive 14 point ROS was performed, reviewed, and the pertinent orthopaedic findings are documented in the HPI.  Vitals:  03/30/21 1052  BP: 128/76    General Physical Examination:   General/Constitutional: No apparent distress: well-nourished and well developed. Eyes: Pupils equal, round with synchronous movement. Lungs: Clear to auscultation HEENT: Normal Vascular: No edema, swelling or tenderness, except as noted in detailed exam. Cardiac: Heart rate and rhythm is regular. Integumentary: No impressive skin lesions present, except as noted in detailed exam. Neuro/Psych: Normal mood and affect, oriented to person, place and time.  Musculoskeletal Examination:  On exam, tenderness around L2.No neuro deficit No clonus Skin intact with palpable kyphotic deformity  Radiographs:  AP and lateral x-rays of the lumbar spine were ordered and personally reviewed today. These show approximately 60 percent compression of L2, worse than his prior CT. No additional fractures noted. 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  Assessment: ICD-10-CM  1. Closed wedge compression fracture of L1 vertebra, initial encounter (CMS-HCC) S32.010A   Plan:  The patient has clinical  findings of significant compression fracture of L2.  We discussed the patient's prior x-ray findings. I recommend kyphoplasty. I explained the surgery and postoperative course in detail. I advised him he needs to ask his cardiologist about stopping his Plavix.  We will schedule the patient for surgery in the near future.  Surgical Risks:  The nature of the condition and the proposed  procedure has been reviewed in detail with the patient. Surgical versus non-surgical options and prognosis for recovery have been reviewed and the inherent risks and benefits of each have been discussed including the risks of infection, bleeding, injury to nerves/blood vessels/tendons, incomplete relief of symptoms, persisting pain and/or stiffness, loss of function, complex regional pain syndrome, failure of the procedure, as appropriate.  Attestation: I, Dawn Royse, am documenting for [employer], MD utilizing [place].    Electronically signed by Lauris Poag, MD at 03/30/2021 2:43 PM EDT Reviewed  H+P. No changes noted.

## 2021-04-04 ENCOUNTER — Encounter: Payer: Self-pay | Admitting: Orthopedic Surgery

## 2021-04-04 ENCOUNTER — Telehealth: Payer: Self-pay | Admitting: Family Medicine

## 2021-04-04 NOTE — Telephone Encounter (Addendum)
Lvm asking [name] to call back.  Need clarification of message.

## 2021-04-04 NOTE — Telephone Encounter (Addendum)
I've filled this out multiple times, again on 04/02/2021 when it was received and it is sitting in my in box since Monday. I've placed it at the top of my in box.  But I've signed this form multiple times in the past few weeks as well. Will forward to [place].  Lattie Haw when you get messages like this please check my inbox first.

## 2021-04-04 NOTE — Telephone Encounter (Signed)
[name] called in and wanted to know about the status of PC ts 40days late she been trying since 6/10 to get a response back.

## 2021-04-04 NOTE — Telephone Encounter (Addendum)
[name], of [facility], returning call.  States she faxed POC order a few times last week and has not heard anything.  Also, faxed on 7/18.  I confirmed she had correct fax #, [phone].    Dr. Darnell Level, do you remember signing order for this pt?  Says they need it asap.

## 2021-04-05 LAB — SURGICAL PATHOLOGY

## 2021-04-05 NOTE — Telephone Encounter (Signed)
Noted  

## 2021-04-19 ENCOUNTER — Other Ambulatory Visit: Payer: Self-pay | Admitting: Family Medicine

## 2021-04-19 DIAGNOSIS — Z789 Other specified health status: Secondary | ICD-10-CM

## 2021-04-19 DIAGNOSIS — S32010A Wedge compression fracture of first lumbar vertebra, initial encounter for closed fracture: Secondary | ICD-10-CM

## 2021-04-19 DIAGNOSIS — Z125 Encounter for screening for malignant neoplasm of prostate: Secondary | ICD-10-CM

## 2021-04-19 DIAGNOSIS — E7800 Pure hypercholesterolemia, unspecified: Secondary | ICD-10-CM

## 2021-04-19 DIAGNOSIS — Z7289 Other problems related to lifestyle: Secondary | ICD-10-CM

## 2021-04-20 ENCOUNTER — Other Ambulatory Visit (INDEPENDENT_AMBULATORY_CARE_PROVIDER_SITE_OTHER): Payer: Medicare PPO

## 2021-04-20 ENCOUNTER — Other Ambulatory Visit: Payer: Self-pay

## 2021-04-20 DIAGNOSIS — Z7289 Other problems related to lifestyle: Secondary | ICD-10-CM | POA: Diagnosis not present

## 2021-04-20 DIAGNOSIS — Z125 Encounter for screening for malignant neoplasm of prostate: Secondary | ICD-10-CM | POA: Diagnosis not present

## 2021-04-20 DIAGNOSIS — E7800 Pure hypercholesterolemia, unspecified: Secondary | ICD-10-CM | POA: Diagnosis not present

## 2021-04-20 DIAGNOSIS — S32010A Wedge compression fracture of first lumbar vertebra, initial encounter for closed fracture: Secondary | ICD-10-CM | POA: Diagnosis not present

## 2021-04-20 DIAGNOSIS — Z789 Other specified health status: Secondary | ICD-10-CM

## 2021-04-20 LAB — COMPREHENSIVE METABOLIC PANEL
ALT: 22 U/L (ref 0–53)
AST: 17 U/L (ref 0–37)
Albumin: 4 g/dL (ref 3.5–5.2)
Alkaline Phosphatase: 91 U/L (ref 39–117)
BUN: 17 mg/dL (ref 6–23)
CO2: 28 mEq/L (ref 19–32)
Calcium: 9.3 mg/dL (ref 8.4–10.5)
Chloride: 104 mEq/L (ref 96–112)
Creatinine, Ser: 0.88 mg/dL (ref 0.40–1.50)
GFR: 90.23 mL/min (ref >60.00)
Glucose, Bld: 115 mg/dL — ABNORMAL HIGH (ref 70–99)
Potassium: 4.2 mEq/L (ref 3.5–5.1)
Sodium: 139 mEq/L (ref 135–145)
Total Bilirubin: 0.5 mg/dL (ref 0.2–1.2)
Total Protein: 6.2 g/dL (ref 6.0–8.3)

## 2021-04-20 LAB — CBC WITH DIFFERENTIAL/PLATELET
Basophils Absolute: 0.1 10*3/uL (ref 0.0–0.1)
Basophils Relative: 1 % (ref 0.0–3.0)
Eosinophils Absolute: 0.5 10*3/uL (ref 0.0–0.7)
Eosinophils Relative: 8.3 % — ABNORMAL HIGH (ref 0.0–5.0)
HCT: 44.4 % (ref 39.0–52.0)
Hemoglobin: 14.7 g/dL (ref 13.0–17.0)
Lymphocytes Relative: 25 % (ref 12.0–46.0)
Lymphs Abs: 1.6 10*3/uL (ref 0.7–4.0)
MCHC: 33.3 g/dL (ref 30.0–36.0)
MCV: 91.1 fl (ref 78.0–100.0)
Monocytes Absolute: 0.5 10*3/uL (ref 0.1–1.0)
Monocytes Relative: 7.1 % (ref 3.0–12.0)
Neutro Abs: 3.8 10*3/uL (ref 1.4–7.7)
Neutrophils Relative %: 58.6 % (ref 43.0–77.0)
Platelets: 274 10*3/uL (ref 150.0–400.0)
RBC: 4.87 Mil/uL (ref 4.22–5.81)
RDW: 14.2 % (ref 11.5–15.5)
WBC: 6.5 10*3/uL (ref 4.0–10.5)

## 2021-04-20 LAB — LIPID PANEL
Cholesterol: 163 mg/dL (ref 0–200)
HDL: 57.6 mg/dL (ref >39.00)
LDL Cholesterol: 81 mg/dL (ref 0–99)
NonHDL: 105.6
Total CHOL/HDL Ratio: 3
Triglycerides: 124 mg/dL (ref 0.0–149.0)
VLDL: 24.8 mg/dL (ref 0.0–40.0)

## 2021-04-20 LAB — PSA: PSA: 1.75 ng/mL (ref 0.10–4.00)

## 2021-04-20 LAB — VITAMIN D 25 HYDROXY (VIT D DEFICIENCY, FRACTURES): VITD: 36.28 ng/mL (ref 30.00–100.00)

## 2021-04-25 ENCOUNTER — Other Ambulatory Visit: Payer: BC Managed Care – PPO

## 2021-05-01 ENCOUNTER — Encounter: Payer: Self-pay | Admitting: Family Medicine

## 2021-05-01 ENCOUNTER — Other Ambulatory Visit: Payer: Self-pay

## 2021-05-01 ENCOUNTER — Ambulatory Visit (INDEPENDENT_AMBULATORY_CARE_PROVIDER_SITE_OTHER): Payer: Medicare PPO | Admitting: Family Medicine

## 2021-05-01 VITALS — BP 118/82 | HR 87 | Temp 98.1°F | Ht 67.5 in | Wt 182.2 lb

## 2021-05-01 DIAGNOSIS — Z7189 Other specified counseling: Secondary | ICD-10-CM | POA: Diagnosis not present

## 2021-05-01 DIAGNOSIS — Z0000 Encounter for general adult medical examination without abnormal findings: Secondary | ICD-10-CM | POA: Diagnosis not present

## 2021-05-01 DIAGNOSIS — S32010D Wedge compression fracture of first lumbar vertebra, subsequent encounter for fracture with routine healing: Secondary | ICD-10-CM

## 2021-05-01 DIAGNOSIS — E7800 Pure hypercholesterolemia, unspecified: Secondary | ICD-10-CM

## 2021-05-01 DIAGNOSIS — Z789 Other specified health status: Secondary | ICD-10-CM

## 2021-05-01 DIAGNOSIS — I2510 Atherosclerotic heart disease of native coronary artery without angina pectoris: Secondary | ICD-10-CM

## 2021-05-01 DIAGNOSIS — R109 Unspecified abdominal pain: Secondary | ICD-10-CM | POA: Insufficient documentation

## 2021-05-01 DIAGNOSIS — Z23 Encounter for immunization: Secondary | ICD-10-CM | POA: Diagnosis not present

## 2021-05-01 DIAGNOSIS — Z7289 Other problems related to lifestyle: Secondary | ICD-10-CM

## 2021-05-01 MED ORDER — AMOXICILLIN-POT CLAVULANATE 875-125 MG PO TABS: 1.0000 | ORAL_TABLET | Freq: Two times a day (BID) | ORAL | 0 refills | Status: AC

## 2021-05-01 MED ORDER — VITAMIN D3 25 MCG (1000 UT) PO CAPS: 1.0000 | ORAL_CAPSULE | Freq: Every day | ORAL | Status: DC

## 2021-05-01 NOTE — Assessment & Plan Note (Addendum)
Suspicious for diverticulitis in h/o same. Reviewed latest recommendations against routine antibiotic use to treat uncomplicated diverticulitis. Discussed treatment instead with bowel rest.  WASP for augmentin printed with indications when to fill and start.  Update if not improving as expected to consider abdominal imaging.  Pt and wife agree with plan.

## 2021-05-01 NOTE — Patient Instructions (Addendum)
Shingrix vaccine today. [id number] today.  Stop fish oil.  Advanced directive packet provided today.  For left sided abdominal pain - possible diverticulitis. Treat with bowel rest for the next 24-48 hours and change to clear liquid diet. If not improving with this, may start augmentin antibiotic printed out today.  If worsening let us know we may need to get CT scan.  Return as needed or in 1 year for next wellness visit /physical.  Health Maintenance After Age 94 After age 98, you are at a higher risk for certain long-term diseases and infections as well as injuries from falls. Falls are a major cause of broken bones and head injuries in people who are older than age 47. Getting regular preventive care can help to keep you healthy and well. Preventive care includes getting regular testing and making lifestyle changes as recommended by your health care provider. Talk with your health care provider about: Which screenings and tests you should have. A screening is a test that checks for a disease when you have no symptoms. A diet and exercise plan that is right for you. What should I know about screenings and tests to prevent falls? Screening and testing are the best ways to find a health problem early. Early diagnosis and treatment give you the best chance of managing medical conditions that are common after age 81. Certain conditions and lifestyle choices may make you more likely to have a fall. Your health care provider may recommend: Regular vision checks. Poor vision and conditions such as cataracts can make you more likely to have a fall. If you wear glasses, make sure to get your prescription updated if your vision changes. Medicine review. Work with your health care provider to regularly review all of the medicines you are taking, including over-the-counter medicines. Ask your health care provider about any side effects that may make you more likely to have a fall. Tell your health care  provider if any medicines that you take make you feel dizzy or sleepy. Osteoporosis screening. Osteoporosis is a condition that causes the bones to get weaker. This can make the bones weak and cause them to break more easily. Blood pressure screening. Blood pressure changes and medicines to control blood pressure can make you feel dizzy. Strength and balance checks. Your health care provider may recommend certain tests to check your strength and balance while standing, walking, or changing positions. Foot health exam. Foot pain and numbness, as well as not wearing proper footwear, can make you more likely to have a fall. Depression screening. You may be more likely to have a fall if you have a fear of falling, feel emotionally low, or feel unable to do activities that you used to do. Alcohol use screening. Using too much alcohol can affect your balance and may make you more likely to have a fall. What actions can I take to lower my risk of falls? General instructions Talk with your health care provider about your risks for falling. Tell your health care provider if: You fall. Be sure to tell your health care provider about all falls, even ones that seem minor. You feel dizzy, sleepy, or off-balance. Take over-the-counter and prescription medicines only as told by your health care provider. These include any supplements. Eat a healthy diet and maintain a healthy weight. A healthy diet includes low-fat dairy products, low-fat (lean) meats, and fiber from whole grains, beans, and lots of fruits and vegetables. Home safety Remove any tripping hazards, such as rugs,  cords, and clutter. Install safety equipment such as grab bars in bathrooms and safety rails on stairs. Keep rooms and walkways well-lit. Activity  Follow a regular exercise program to stay fit. This will help you maintain your balance. Ask your health care provider what types of exercise are appropriate for you. If you need a cane or  walker, use it as recommended by your health care provider. Wear supportive shoes that have nonskid soles.  Lifestyle Do not drink alcohol if your health care provider tells you not to drink. If you drink alcohol, limit how much you have: 0-1 drink a day for women. 0-2 drinks a day for men. Be aware of how much alcohol is in your drink. In the [place] one drink equals one typical bottle of beer (12 oz), one-half glass of wine (5 oz), or one shot of hard liquor (1 oz). Do not use any products that contain nicotine or tobacco, such as cigarettes and e-cigarettes. If you need help quitting, ask your health care provider. Summary Having a healthy lifestyle and getting preventive care can help to protect your health and wellness after age 73. Screening and testing are the best way to find a health problem early and help you avoid having a fall. Early diagnosis and treatment give you the best chance for managing medical conditions that are more common for people who are older than age 52. Falls are a major cause of broken bones and head injuries in people who are older than age 69. Take precautions to prevent a fall at home. Work with your health care provider to learn what changes you can make to improve your health and wellness and to prevent falls. This information is not intended to replace advice given to you by your health care provider. Make sure you discuss any questions you have with your healthcare provider. Document Revised: 08/18/2020 Document Reviewed: 08/18/2020 Elsevier Patient Education  2022 [employer].

## 2021-05-01 NOTE — Progress Notes (Signed)
Patient ID: Adam Daniels, male    DOB: 11-20-1955, 65 y.o.   MRN: [id number]  This visit was conducted in person.  BP 118/82   Pulse 87   Temp 98.1 F (36.7 C) (Temporal)   Ht 5' 7.5" (1.715 m)   Wt 182 lb 3 oz (82.6 kg)   SpO2 97%   BMI 28.11 kg/m    CC: welcome to medicare visit  Subjective:   HPI: Adam Daniels is a 65 y.o. male presenting on 05/01/2021 for Welcome to Medicare Exam (Pt accompanied by wife, [name] temp 97.4.)   Hearing Screening   '500Hz'$  '1000Hz'$  '2000Hz'$  '4000Hz'$   Right ear 40 40 0 0  Left ear 40 40 0 0  Comments: Wears bilateral hearing aids.  Not wearing at today's OV.   Vision Screening   Right eye Left eye Both eyes  Without correction     With correction '20/25 20/25 20/20 '$    Flowsheet Row Office Visit from 05/01/2021 in [place] at [facility] Total Score 0       Fall Risk  05/01/2021 04/27/2020  Falls in the past year? 1 0  Number falls in past yr: 0 0  Injury with Fall? 1 0  Comment Back fx -   Traumatic compression fracture to L1 suffered after 47f fall off ladder 02/2021 s/p kyphoplasty 04/03/2021 by Dr MRudene Christianswith improvement in pain control.   [facility] and plavix as well as atorvastatin. [place]21.   2d h/o L sided abdominal pain points to lateral abdomen. Possible constipation. No fevers/chills, blood in stool, urine symptoms, nausea/vomiting. Known h/o diverticulitis, last episode was several years ago. He has been eating more cucumbers and nuts and tomato sandwiches.   Preventative: COLONOSCOPY Date: 02/2017 ([name] severe diverticulosis, rpt 10 yrs  Prostate cancer screening - he does have fmhx prostate cancer (father '@60s'$ ) so continue yearly screening. No nocturia, has strong stream.  Lung cancer screening - quit 1994 AAA screen - max 20 PY hx Flu yearly  CUnion3/2021, 12/2019, booster 07/2020,  03/2021 Prevnar-20 today Td - 2007, Tdap 2019  Shingrix - 07/2020, rpt today Advanced directives - discussed. Does not have this set up - planning to see lawyer to set this up. Advanced directive packet provided. Wife would be HCPOA.  Seat belt use discussed  Sunscreen use discussed. Denies changing moles.  Ex smoker - quit remotely (1994)  Alcohol - ~4 beers per week Dentist - Q6 mo  Eye exam - yearly Has hearing aides.  Bowel - no significant constipation  Bladder - no incontinence   Caffeine: 1 cup coffee/day   Lives with wife, 2 dogs   Occupation: heating and air - retired  Activity: [place] walk at [employer] [place]  Diet: fruits/vegetables daily, good water - good chicken and fish     Relevant past medical, surgical, family and social history reviewed and updated as indicated. Interim medical history since our last visit reviewed. Allergies and medications reviewed and updated. Outpatient Medications Prior to Visit  Medication Sig Dispense Refill   ASPIRIN LOW DOSE 81 MG EC tablet Take 81 mg by mouth daily.     atorvastatin (LIPITOR) 20 MG tablet Take 20 mg by mouth daily.     clopidogrel (PLAVIX) 75 MG tablet Take 75 mg by mouth daily.     fluocinonide ointment (LIDEX) 0.05 % Apply  1 application topically 2 (two) times daily as needed for rash.     Multiple Vitamins-Minerals (HAIR/SKIN/NAILS) CAPS Take 1 tablet by mouth daily.     VITAMIN E PO Take 1 capsule by mouth daily.     Omega-3 Fatty Acids (FISH OIL PO) Take 1 capsule by mouth daily.     No facility-administered medications prior to visit.     Per HPI unless specifically indicated in ROS section below Review of Systems  Constitutional:  Negative for activity change, appetite change, chills, fatigue, fever and unexpected weight change.  HENT:  Negative for hearing loss.   Eyes:  Negative for visual disturbance.  Respiratory:  Negative for cough, chest tightness, shortness of breath and wheezing.   Cardiovascular:   Negative for chest pain, palpitations and leg swelling.  Gastrointestinal:  Positive for abdominal pain (L sided) and constipation (today). Negative for abdominal distention, blood in stool, diarrhea, nausea and vomiting.  Genitourinary:  Negative for difficulty urinating and hematuria.  Musculoskeletal:  Negative for arthralgias, myalgias and neck pain.  Skin:  Negative for rash.  Neurological:  Negative for dizziness, seizures, syncope and headaches.  Hematological:  Negative for adenopathy. Bruises/bleeds easily.  Psychiatric/Behavioral:  Negative for dysphoric mood. The patient is not nervous/anxious.    Objective:  BP 118/82   Pulse 87   Temp 98.1 F (36.7 C) (Temporal)   Ht 5' 7.5" (1.715 m)   Wt 182 lb 3 oz (82.6 kg)   SpO2 97%   BMI 28.11 kg/m   Wt Readings from Last 3 Encounters:  05/01/21 182 lb 3 oz (82.6 kg)  04/03/21 177 lb (80.3 kg)  04/02/21 178 lb (80.7 kg)      Physical Exam Vitals and nursing note reviewed.  Constitutional:      General: He is not in acute distress.    Appearance: Normal appearance. He is well-developed. He is not ill-appearing.  HENT:     Head: Normocephalic and atraumatic.     Right Ear: Hearing, tympanic membrane, ear canal and external ear normal.     Left Ear: Hearing, tympanic membrane, ear canal and external ear normal.  Eyes:     General: No scleral icterus.    Extraocular Movements: Extraocular movements intact.     Conjunctiva/sclera: Conjunctivae normal.     Pupils: Pupils are equal, round, and reactive to light.  Neck:     Thyroid: No thyroid mass or thyromegaly.  Cardiovascular:     Rate and Rhythm: Normal rate and regular rhythm.     Pulses: Normal pulses.          Radial pulses are 2+ on the right side and 2+ on the left side.     Heart sounds: Normal heart sounds. No murmur heard. Pulmonary:     Effort: Pulmonary effort is normal. No respiratory distress.     Breath sounds: Normal breath sounds. No wheezing, rhonchi  or rales.  Abdominal:     General: Bowel sounds are normal. There is no distension.     Palpations: Abdomen is soft. There is no mass.     Tenderness: There is abdominal tenderness (mod) in the left lower quadrant. There is guarding. There is no rebound. Negative signs include Murphy's sign.     Hernia: No hernia is present.  Musculoskeletal:        General: Normal range of motion.     Cervical back: Normal range of motion and neck supple.     Right lower leg: No edema.  Left lower leg: No edema.  Lymphadenopathy:     Cervical: No cervical adenopathy.  Skin:    General: Skin is warm and dry.     Findings: No rash.  Neurological:     General: No focal deficit present.     Mental Status: He is alert and oriented to person, place, and time.     Comments:  Recall 3/3 Calculation 5/5 DLROW   Psychiatric:        Mood and Affect: Mood normal.        Behavior: Behavior normal.        Thought Content: Thought content normal.        Judgment: Judgment normal.      Results for orders placed or performed in visit on 04/20/21  CBC with Differential/Platelet  Result Value Ref Range   WBC 6.5 4.0 - 10.5 K/uL   RBC 4.87 4.22 - 5.81 Mil/uL   Hemoglobin 14.7 13.0 - 17.0 g/dL   HCT 44.4 39.0 - 52.0 %   MCV 91.1 78.0 - 100.0 fl   MCHC 33.3 30.0 - 36.0 g/dL   RDW 14.2 11.5 - 15.5 %   Platelets 274.0 150.0 - 400.0 K/uL   Neutrophils Relative % 58.6 43.0 - 77.0 %   Lymphocytes Relative 25.0 12.0 - 46.0 %   Monocytes Relative 7.1 3.0 - 12.0 %   Eosinophils Relative 8.3 (H) 0.0 - 5.0 %   Basophils Relative 1.0 0.0 - 3.0 %   Neutro Abs 3.8 1.4 - 7.7 K/uL   Lymphs Abs 1.6 0.7 - 4.0 K/uL   Monocytes Absolute 0.5 0.1 - 1.0 K/uL   Eosinophils Absolute 0.5 0.0 - 0.7 K/uL   Basophils Absolute 0.1 0.0 - 0.1 K/uL  VITAMIN D 25 Hydroxy (Vit-D Deficiency, Fractures)  Result Value Ref Range   VITD 36.28 30.00 - 100.00 ng/mL  PSA  Result Value Ref Range   PSA 1.75 0.10 - 4.00 ng/mL   Comprehensive metabolic panel  Result Value Ref Range   Sodium 139 135 - 145 mEq/L   Potassium 4.2 3.5 - 5.1 mEq/L   Chloride 104 96 - 112 mEq/L   CO2 28 19 - 32 mEq/L   Glucose, Bld 115 (H) 70 - 99 mg/dL   BUN 17 6 - 23 mg/dL   Creatinine, Ser 0.88 0.40 - 1.50 mg/dL   Total Bilirubin 0.5 0.2 - 1.2 mg/dL   Alkaline Phosphatase 91 39 - 117 U/L   AST 17 0 - 37 U/L   ALT 22 0 - 53 U/L   Total Protein 6.2 6.0 - 8.3 g/dL   Albumin 4.0 3.5 - 5.2 g/dL   GFR 90.23 >60.00 mL/min   Calcium 9.3 8.4 - 10.5 mg/dL  Lipid panel  Result Value Ref Range   Cholesterol 163 0 - 200 mg/dL   Triglycerides 124.0 0.0 - 149.0 mg/dL   HDL 57.60 >39.00 mg/dL   VLDL 24.8 0.0 - 40.0 mg/dL   LDL Cholesterol 81 0 - 99 mg/dL   Total CHOL/HDL Ratio 3    NonHDL 105.60     Assessment & Plan:  This visit occurred during the SARS-CoV-2 public health emergency.  Safety protocols were in place, including screening questions prior to the visit, additional usage of staff PPE, and extensive cleaning of exam room while observing appropriate contact time as indicated for disinfecting solutions.   Problem List Items Addressed This Visit     Welcome to Medicare preventive visit - Primary (Chronic)  I have personally reviewed the Medicare Annual Wellness questionnaire and have noted 1. The patient's medical and social history 2. Their use of alcohol, tobacco or illicit drugs 3. Their current medications and supplements 4. The patient's functional ability including ADL's, fall risks, home safety risks and hearing or visual impairment. Cognitive function has been assessed and addressed as indicated.  5. Diet and physical activity 6. Evidence for depression or mood disorders The patients weight, height, BMI have been recorded in the chart. I have made referrals, counseling and provided education to the patient based on review of the above and I have provided the pt with a written personalized care plan for preventive  services. Provider list updated.. See scanned questionairre as needed for further documentation. Reviewed preventative protocols and updated unless pt declined.  EKG deferred given upcoming cardiology appt.       Advanced directives, counseling/discussion (Chronic)    Advanced directives - discussed. Does not have this set up - planning to see lawyer to set this up. Advanced directive packet provided. Wife would be HCPOA.       HYPERCHOLESTEROLEMIA    Chronic, stable on atorvastatin '20mg'$  daily with LDL at goal - continue.  The 10-year ASCVD risk score [name], et al., 2013) is: 8.9%   Values used to calculate the score:     Age: 58 years     Sex: Male     Is Non-Hispanic African American: No     Diabetic: No     Tobacco smoker: No     Systolic Blood Pressure: 123456 mmHg     Is BP treated: No     HDL Cholesterol: 57.6 mg/dL     Total Cholesterol: 163 mg/dL       Alcohol use    <5 drinks/wk      CAD (coronary artery disease)    S/p MI 05/2020. Now on aspirin plavix and statin, followed by cardiology Dr [facility]indee Lame at [facility]. Upcoming appt in October.       Traumatic compression fracture of L1 lumbar vertebra (HCC)    Doing better after kyphoplasty. Now off pain medicines.       Left sided abdominal pain    Suspicious for diverticulitis in h/o same. Reviewed latest recommendations against routine antibiotic use to treat uncomplicated diverticulitis. Discussed treatment instead with bowel rest.  WASP for augmentin printed with indications when to fill and start.  Update if not improving as expected to consider abdominal imaging.  Pt and wife agree with plan.       Other Visit Diagnoses     Need for vaccination against Streptococcus pneumoniae       Relevant Orders   Pneumococcal conjugate vaccine 20-valent (Completed)   Need for shingles vaccine       Relevant Orders   Varicella-zoster vaccine IM (Completed)        Meds ordered this encounter  Medications    Cholecalciferol (VITAMIN D3) 25 MCG (1000 UT) CAPS    Sig: Take 1 capsule (1,000 Units total) by mouth daily.    Dispense:  30 capsule   amoxicillin-clavulanate (AUGMENTIN) 875-125 MG tablet    Sig: Take 1 tablet by mouth 2 (two) times daily for 10 days.    Dispense:  20 tablet    Refill:  0    Orders Placed This Encounter  Procedures   Pneumococcal conjugate vaccine 20-valent   Varicella-zoster vaccine IM     Patient instructions: Shingrix vaccine today. [id number] today.  Stop fish  oil.  Advanced directive packet provided today.  For left sided abdominal pain - possible diverticulitis. Treat with bowel rest for the next 24-48 hours and change to clear liquid diet. If not improving with this, may start augmentin antibiotic printed out today.  If worsening let us know we may need to get CT scan.  Return as needed or in 1 year for next wellness visit /physical.  Follow up plan: Return in about 1 year (around 05/01/2022) for annual exam, prior fasting for blood work, medicare wellness visit.  [name], MD

## 2021-05-01 NOTE — Assessment & Plan Note (Signed)
Doing better after kyphoplasty. Now off pain medicines.

## 2021-05-01 NOTE — Assessment & Plan Note (Signed)
<  5 drinks/wk

## 2021-05-01 NOTE — Assessment & Plan Note (Addendum)
Chronic, stable on atorvastatin '20mg'$  daily with LDL at goal - continue.  The 10-year ASCVD risk score Adam Bussing DC Brooke Bonito., et al., 2013) is: 8.9%   Values used to calculate the score:     Age: 65 years     Sex: Male     Is Non-Hispanic African American: No     Diabetic: No     Tobacco smoker: No     Systolic Blood Pressure: 123456 mmHg     Is BP treated: No     HDL Cholesterol: 57.6 mg/dL     Total Cholesterol: 163 mg/dL

## 2021-05-01 NOTE — Assessment & Plan Note (Addendum)
S/p MI 05/2020. Now on aspirin plavix and statin, followed by cardiology Dr Cindee Lame at [facility]. Upcoming appt in October.

## 2021-05-01 NOTE — Assessment & Plan Note (Addendum)
I have personally reviewed the Medicare Annual Wellness questionnaire and have noted 1. The patient's medical and social history 2. Their use of alcohol, tobacco or illicit drugs 3. Their current medications and supplements 4. The patient's functional ability including ADL's, fall risks, home safety risks and hearing or visual impairment. Cognitive function has been assessed and addressed as indicated.  5. Diet and physical activity 6. Evidence for depression or mood disorders The patients weight, height, BMI have been recorded in the chart. I have made referrals, counseling and provided education to the patient based on review of the above and I have provided the pt with a written personalized care plan for preventive services. Provider list updated.. See scanned questionairre as needed for further documentation. Reviewed preventative protocols and updated unless pt declined.  EKG deferred given upcoming cardiology appt.

## 2021-05-01 NOTE — Assessment & Plan Note (Signed)
Advanced directives - discussed. Does not have this set up - planning to see lawyer to set this up. Advanced directive packet provided. Wife would be HCPOA.

## 2021-06-27 DIAGNOSIS — E785 Hyperlipidemia, unspecified: Secondary | ICD-10-CM | POA: Diagnosis not present

## 2021-06-27 DIAGNOSIS — I25810 Atherosclerosis of coronary artery bypass graft(s) without angina pectoris: Secondary | ICD-10-CM | POA: Diagnosis not present

## 2021-06-27 DIAGNOSIS — I10 Essential (primary) hypertension: Secondary | ICD-10-CM | POA: Diagnosis not present

## 2022-04-18 ENCOUNTER — Telehealth: Payer: Self-pay | Admitting: Family Medicine

## 2022-04-18 NOTE — Telephone Encounter (Signed)
Left message for patient to call back and schedule Medicare Annual Wellness Visit (AWV).   Please offer to do virtually or by telephone.   Last AWV: 05/01/2021  Please schedule at anytime with [facility] Advisor schedule 2  45 minute appointent  If any questions, please contact me at [phone]

## 2022-04-21 ENCOUNTER — Other Ambulatory Visit: Payer: Self-pay | Admitting: Family Medicine

## 2022-04-21 DIAGNOSIS — E7800 Pure hypercholesterolemia, unspecified: Secondary | ICD-10-CM

## 2022-04-21 DIAGNOSIS — Z125 Encounter for screening for malignant neoplasm of prostate: Secondary | ICD-10-CM

## 2022-04-26 ENCOUNTER — Other Ambulatory Visit (INDEPENDENT_AMBULATORY_CARE_PROVIDER_SITE_OTHER): Payer: Medicare PPO

## 2022-04-26 DIAGNOSIS — E7800 Pure hypercholesterolemia, unspecified: Secondary | ICD-10-CM | POA: Diagnosis not present

## 2022-04-26 DIAGNOSIS — Z125 Encounter for screening for malignant neoplasm of prostate: Secondary | ICD-10-CM | POA: Diagnosis not present

## 2022-04-26 LAB — LIPID PANEL
Cholesterol: 162 mg/dL (ref 0–200)
HDL: 59 mg/dL (ref >39.00)
LDL Cholesterol: 91 mg/dL (ref 0–99)
NonHDL: 103.42
Total CHOL/HDL Ratio: 3
Triglycerides: 62 mg/dL (ref 0.0–149.0)
VLDL: 12.4 mg/dL (ref 0.0–40.0)

## 2022-04-26 LAB — COMPREHENSIVE METABOLIC PANEL
ALT: 25 U/L (ref 0–53)
AST: 19 U/L (ref 0–37)
Albumin: 4.2 g/dL (ref 3.5–5.2)
Alkaline Phosphatase: 93 U/L (ref 39–117)
BUN: 10 mg/dL (ref 6–23)
CO2: 28 mEq/L (ref 19–32)
Calcium: 9.1 mg/dL (ref 8.4–10.5)
Chloride: 104 mEq/L (ref 96–112)
Creatinine, Ser: 0.83 mg/dL (ref 0.40–1.50)
GFR: 91.18 mL/min (ref >60.00)
Glucose, Bld: 96 mg/dL (ref 70–99)
Potassium: 4.6 mEq/L (ref 3.5–5.1)
Sodium: 139 mEq/L (ref 135–145)
Total Bilirubin: 0.6 mg/dL (ref 0.2–1.2)
Total Protein: 6.4 g/dL (ref 6.0–8.3)

## 2022-04-26 LAB — PSA: PSA: 1.45 ng/mL (ref 0.10–4.00)

## 2022-05-02 ENCOUNTER — Ambulatory Visit (INDEPENDENT_AMBULATORY_CARE_PROVIDER_SITE_OTHER): Payer: Medicare PPO

## 2022-05-02 VITALS — Wt 182.0 lb

## 2022-05-02 DIAGNOSIS — Z0000 Encounter for general adult medical examination without abnormal findings: Secondary | ICD-10-CM | POA: Diagnosis not present
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
## 2022-05-02 NOTE — Patient Instructions (Signed)
Adam Daniels , Thank you for taking time to come for your Medicare Wellness Visit. I appreciate your ongoing commitment to your health goals. Please review the following plan we discussed and let me know if I can assist you in the future.   Screening recommendations/referrals: Colonoscopy: 03/12/17 Recommended yearly ophthalmology/optometry visit for glaucoma screening and checkup Recommended yearly dental visit for hygiene and checkup  Vaccinations: Influenza vaccine: 06/21/21 Pneumococcal vaccine: 05/01/21 Tdap vaccine: 04/14/18 Shingles vaccine: Shingrgix  07/25/20, 05/01/21   Covid-19: 12/10/19, 01/04/20, 08/04/20, 03/20/21  Advanced directives: no  Conditions/risks identified: none  Next appointment: Follow up in one year for your annual wellness visit. 05/05/23 @ 2:45 pm by phone  Preventive Care 65 Years and Older, Male Preventive care refers to lifestyle choices and visits with your health care provider that can promote health and wellness. What does preventive care include? A yearly physical exam. This is also called an annual well check. Dental exams once or twice a year. Routine eye exams. Ask your health care provider how often you should have your eyes checked. Personal lifestyle choices, including: Daily care of your teeth and gums. Regular physical activity. Eating a healthy diet. Avoiding tobacco and drug use. Limiting alcohol use. Practicing safe sex. Taking low doses of aspirin every day. Taking vitamin and mineral supplements as recommended by your health care provider. What happens during an annual well check? The services and screenings done by your health care provider during your annual well check will depend on your age, overall health, lifestyle risk factors, and family history of disease. Counseling  Your health care provider may ask you questions about your: Alcohol use. Tobacco use. Drug use. Emotional well-being. Home and relationship well-being. Sexual  activity. Eating habits. History of falls. Memory and ability to understand (cognition). Work and work Statistician. Screening  You may have the following tests or measurements: Height, weight, and BMI. Blood pressure. Lipid and cholesterol levels. These may be checked every 5 years, or more frequently if you are over 9 years old. Skin check. Lung cancer screening. You may have this screening every year starting at age 46 if you have a 30-pack-year history of smoking and currently smoke or have quit within the past 15 years. Fecal occult blood test (FOBT) of the stool. You may have this test every year starting at age 70. Flexible sigmoidoscopy or colonoscopy. You may have a sigmoidoscopy every 5 years or a colonoscopy every 10 years starting at age 4. Prostate cancer screening. Recommendations will vary depending on your family history and other risks. Hepatitis C blood test. Hepatitis B blood test. Sexually transmitted disease (STD) testing. Diabetes screening. This is done by checking your blood sugar (glucose) after you have not eaten for a while (fasting). You may have this done every 1-3 years. Abdominal aortic aneurysm (AAA) screening. You may need this if you are a current or former smoker. Osteoporosis. You may be screened starting at age 44 if you are at high risk. Talk with your health care provider about your test results, treatment options, and if necessary, the need for more tests. Vaccines  Your health care provider may recommend certain vaccines, such as: Influenza vaccine. This is recommended every year. Tetanus, diphtheria, and acellular pertussis (Tdap, Td) vaccine. You may need a Td booster every 10 years. Zoster vaccine. You may need this after age 52. Pneumococcal 13-valent conjugate (PCV13) vaccine. One dose is recommended after age 51. Pneumococcal polysaccharide (PPSV23) vaccine. One dose is recommended after age 48. Talk  to your health care provider about which  screenings and vaccines you need and how often you need them. This information is not intended to replace advice given to you by your health care provider. Make sure you discuss any questions you have with your health care provider. Document Released: 09/29/2015 Document Revised: 05/22/2016 Document Reviewed: 07/04/2015 Elsevier Interactive Patient Education  2017 [place] Prevention in the Home Falls can cause injuries. They can happen to people of all ages. There are many things you can do to make your home safe and to help prevent falls. What can I do on the outside of my home? Regularly fix the edges of walkways and driveways and fix any cracks. Remove anything that might make you trip as you walk through a door, such as a raised step or threshold. Trim any bushes or trees on the path to your home. Use bright outdoor lighting. Clear any walking paths of anything that might make someone trip, such as rocks or tools. Regularly check to see if handrails are loose or broken. Make sure that both sides of any steps have handrails. Any raised decks and porches should have guardrails on the edges. Have any leaves, snow, or ice cleared regularly. Use sand or salt on walking paths during winter. Clean up any spills in your garage right away. This includes oil or grease spills. What can I do in the bathroom? Use night lights. Install grab bars by the toilet and in the tub and shower. Do not use towel bars as grab bars. Use non-skid mats or decals in the tub or shower. If you need to sit down in the shower, use a plastic, non-slip stool. Keep the floor dry. Clean up any water that spills on the floor as soon as it happens. Remove soap buildup in the tub or shower regularly. Attach bath mats securely with double-sided non-slip rug tape. Do not have throw rugs and other things on the floor that can make you trip. What can I do in the bedroom? Use night lights. Make sure that you have a  light by your bed that is easy to reach. Do not use any sheets or blankets that are too big for your bed. They should not hang down onto the floor. Have a firm chair that has side arms. You can use this for support while you get dressed. Do not have throw rugs and other things on the floor that can make you trip. What can I do in the kitchen? Clean up any spills right away. Avoid walking on wet floors. Keep items that you use a lot in easy-to-reach places. If you need to reach something above you, use a strong step stool that has a grab bar. Keep electrical cords out of the way. Do not use floor polish or wax that makes floors slippery. If you must use wax, use non-skid floor wax. Do not have throw rugs and other things on the floor that can make you trip. What can I do with my stairs? Do not leave any items on the stairs. Make sure that there are handrails on both sides of the stairs and use them. Fix handrails that are broken or loose. Make sure that handrails are as long as the stairways. Check any carpeting to make sure that it is firmly attached to the stairs. Fix any carpet that is loose or worn. Avoid having throw rugs at the top or bottom of the stairs. If you do have throw rugs,  attach them to the floor with carpet tape. Make sure that you have a light switch at the top of the stairs and the bottom of the stairs. If you do not have them, ask someone to add them for you. What else can I do to help prevent falls? Wear shoes that: Do not have high heels. Have rubber bottoms. Are comfortable and fit you well. Are closed at the toe. Do not wear sandals. If you use a stepladder: Make sure that it is fully opened. Do not climb a closed stepladder. Make sure that both sides of the stepladder are locked into place. Ask someone to hold it for you, if possible. Clearly mark and make sure that you can see: Any grab bars or handrails. First and last steps. Where the edge of each step  is. Use tools that help you move around (mobility aids) if they are needed. These include: Canes. Walkers. Scooters. Crutches. Turn on the lights when you go into a dark area. Replace any light bulbs as soon as they burn out. Set up your furniture so you have a clear path. Avoid moving your furniture around. If any of your floors are uneven, fix them. If there are any pets around you, be aware of where they are. Review your medicines with your doctor. Some medicines can make you feel dizzy. This can increase your chance of falling. Ask your doctor what other things that you can do to help prevent falls. This information is not intended to replace advice given to you by your health care provider. Make sure you discuss any questions you have with your health care provider. Document Released: 06/29/2009 Document Revised: 02/08/2016 Document Reviewed: 10/07/2014 Elsevier Interactive Patient Education  2017 [employer].

## 2022-05-03 ENCOUNTER — Ambulatory Visit (INDEPENDENT_AMBULATORY_CARE_PROVIDER_SITE_OTHER): Payer: Medicare PPO | Admitting: Family Medicine

## 2022-05-03 ENCOUNTER — Encounter: Payer: Self-pay | Admitting: Family Medicine

## 2022-05-03 VITALS — BP 120/84 | HR 80 | Temp 97.5°F | Ht 66.75 in | Wt 194.0 lb

## 2022-05-03 DIAGNOSIS — Z0000 Encounter for general adult medical examination without abnormal findings: Secondary | ICD-10-CM | POA: Diagnosis not present

## 2022-05-03 DIAGNOSIS — Z789 Other specified health status: Secondary | ICD-10-CM | POA: Diagnosis not present

## 2022-05-03 DIAGNOSIS — E7800 Pure hypercholesterolemia, unspecified: Secondary | ICD-10-CM

## 2022-05-03 DIAGNOSIS — Z7189 Other specified counseling: Secondary | ICD-10-CM | POA: Diagnosis not present

## 2022-05-03 DIAGNOSIS — I2510 Atherosclerotic heart disease of native coronary artery without angina pectoris: Secondary | ICD-10-CM

## 2022-05-03 NOTE — Assessment & Plan Note (Signed)
Regularly sees cardiology at the [facility] - appreciate their care.

## 2022-05-03 NOTE — Progress Notes (Signed)
Patient ID: Adam Daniels, male    DOB: 04-08-1956, 66 y.o.   MRN: [id number]  This visit was conducted in person.  BP 120/84   Pulse 80   Temp (!) 97.5 F (36.4 C) (Temporal)   Ht 5' 6.75" (1.695 m)   Wt 194 lb (88 kg)   SpO2 95%   BMI 30.61 kg/m    CC: CPE Subjective:   HPI: Adam Daniels is a 66 y.o. male presenting on 05/03/2022 for Annual Exam [employer] prt 2. Pt accompanied by wife, [name]. )   Saw health advisor last week for medicare wellness visit. Note reviewed.  [place] time here, [place] time at beach [facility])  No results found.  Flowsheet Row Clinical Support from 05/02/2022 in [place] at [place]  PHQ-2 Total Score 0          05/02/2022    2:39 PM 04/28/2022    8:57 AM 05/01/2021   10:23 AM 04/27/2020   10:33 AM  Fall Risk   Falls in the past year? 0 0 1 0  Number falls in past yr: 0  0 0  Injury with Fall? 0  1 0  Comment   Back fx   Risk for fall due to : No Fall Risks;History of fall(s)     Follow up Falls evaluation completed;Falls prevention discussed      Traumatic compression fracture to L1 suffered after 3f fall off ladder 02/2021 s/p kyphoplasty 04/03/2021 by Dr MRudene Christianswith improvement in pain control. "I've learned to live with" back pain - comes and goes.    [facility] and plavix as well as atorvastatin. [place].    Preventative: COLONOSCOPY Date: 02/2017 ([name] severe diverticulosis, rpt 10 yrs  Prostate cancer screening - he does have fmhx prostate cancer  (father '@60s'$ ) so will continue yearly screening. No nocturia, has strong stream.  Lung cancer screening - not eligible as he quit smoking 1994 AAA screen - max 20 P[place] hx [place]  Flu yearly  [place], 12/[place], booster 07/2020, 03/2021  Prevnar-20 04/2021 Td - 2007, Tdap 2019  Shingrix - 07/2020, 04/2021  Advanced directives -  discussed. Does not have this set up - planning to see lawyer to set this up. Advanced directive packet provided. Wife would be HCPOA. Full code, but would not want prolonged life support if terminal condition.  Seat belt use discussed  Sunscreen use discussed. Denies changing moles.  Sleep - averaging 8 hours/night  Ex smoker - quit remotely (1994)  Alcohol - ~4 beers per week Dentist - Q6 mo  Eye exam - yearly Wears hearing aides - not currently.  Bowel - no constipation  Bladder - no incontinence  Caffeine: 1 cup coffee/day   Lives with wife, 2 dogs   Occupation: heating and air - retired  Activity: [place] [employer] [place], started going to gym  Diet: fruits/vegetables daily, good water - good chicken and fish, avoids red meat     Relevant past medical, surgical, family and social history reviewed and updated as indicated. Interim medical history since our last visit reviewed. Allergies and medications reviewed and updated. Outpatient Medications Prior to Visit  Medication Sig Dispense Refill   ASPIRIN LOW DOSE 81 MG EC tablet Take 81 mg by mouth daily.     atorvastatin (LIPITOR) 20 MG tablet Take  20 mg by mouth daily.     clopidogrel (PLAVIX) 75 MG tablet Take 75 mg by mouth daily.     etodolac (LODINE) 500 MG tablet Take 1 tablet by mouth 2 (two) times daily. (Patient not taking: Reported on 05/02/2022)     No facility-administered medications prior to visit.     Per HPI unless specifically indicated in ROS section below Review of Systems  Constitutional:  Negative for activity change, appetite change, chills, fatigue, fever and unexpected weight change.  HENT:  Negative for hearing loss.   Eyes:  Negative for visual disturbance.  Respiratory:  Negative for cough, chest tightness, shortness of breath and wheezing.   Cardiovascular:  Negative for chest pain, palpitations and leg swelling.  Gastrointestinal:  Negative for abdominal distention, abdominal pain, blood in  stool, constipation, diarrhea, nausea and vomiting.  Genitourinary:  Negative for difficulty urinating and hematuria.  Musculoskeletal:  Negative for arthralgias, myalgias and neck pain.  Skin:  Negative for rash.  Neurological:  Negative for dizziness, seizures, syncope and headaches.  Hematological:  Negative for adenopathy. Bruises/bleeds easily.  Psychiatric/Behavioral:  Negative for dysphoric mood. The patient is not nervous/anxious.     Objective:  BP 120/84   Pulse 80   Temp (!) 97.5 F (36.4 C) (Temporal)   Ht 5' 6.75" (1.695 m)   Wt 194 lb (88 kg)   SpO2 95%   BMI 30.61 kg/m   Wt Readings from Last 3 Encounters:  05/03/22 194 lb (88 kg)  05/02/22 182 lb (82.6 kg)  05/01/21 182 lb 3 oz (82.6 kg)      Physical Exam Vitals and nursing note reviewed.  Constitutional:      General: He is not in acute distress.    Appearance: Normal appearance. He is well-developed. He is not ill-appearing.  HENT:     Head: Normocephalic and atraumatic.     Right Ear: Hearing, tympanic membrane, ear canal and external ear normal.     Left Ear: Hearing, tympanic membrane, ear canal and external ear normal.  Eyes:     General: No scleral icterus.    Extraocular Movements: Extraocular movements intact.     Conjunctiva/sclera: Conjunctivae normal.     Pupils: Pupils are equal, round, and reactive to light.  Neck:     Thyroid: No thyroid mass or thyromegaly.     Vascular: No carotid bruit.  Cardiovascular:     Rate and Rhythm: Normal rate and regular rhythm.     Pulses: Normal pulses.          Radial pulses are 2+ on the right side and 2+ on the left side.     Heart sounds: Normal heart sounds. No murmur heard. Pulmonary:     Effort: Pulmonary effort is normal. No respiratory distress.     Breath sounds: Normal breath sounds. No wheezing, rhonchi or rales.  Abdominal:     General: Bowel sounds are normal. There is no distension.     Palpations: Abdomen is soft. There is no mass.      Tenderness: There is no abdominal tenderness. There is no guarding or rebound.     Hernia: No hernia is present.     Comments: No abd bruits  Musculoskeletal:        General: Normal range of motion.     Cervical back: Normal range of motion and neck supple.     Right lower leg: No edema.     Left lower leg: No edema.  Lymphadenopathy:  Cervical: No cervical adenopathy.  Skin:    General: Skin is warm and dry.     Findings: No rash.  Neurological:     General: No focal deficit present.     Mental Status: He is alert and oriented to person, place, and time.  Psychiatric:        Mood and Affect: Mood normal.        Behavior: Behavior normal.        Thought Content: Thought content normal.        Judgment: Judgment normal.       Results for orders placed or performed in visit on 04/26/22  PSA  Result Value Ref Range   PSA 1.45 0.10 - 4.00 ng/mL  Comprehensive metabolic panel  Result Value Ref Range   Sodium 139 135 - 145 mEq/L   Potassium 4.6 3.5 - 5.1 mEq/L   Chloride 104 96 - 112 mEq/L   CO2 28 19 - 32 mEq/L   Glucose, Bld 96 70 - 99 mg/dL   BUN 10 6 - 23 mg/dL   Creatinine, Ser 0.83 0.40 - 1.50 mg/dL   Total Bilirubin 0.6 0.2 - 1.2 mg/dL   Alkaline Phosphatase 93 39 - 117 U/L   AST 19 0 - 37 U/L   ALT 25 0 - 53 U/L   Total Protein 6.4 6.0 - 8.3 g/dL   Albumin 4.2 3.5 - 5.2 g/dL   GFR 91.18 >60.00 mL/min   Calcium 9.1 8.4 - 10.5 mg/dL  Lipid panel  Result Value Ref Range   Cholesterol 162 0 - 200 mg/dL   Triglycerides 62.0 0.0 - 149.0 mg/dL   HDL 59.00 >39.00 mg/dL   VLDL 12.4 0.0 - 40.0 mg/dL   LDL Cholesterol 91 0 - 99 mg/dL   Total CHOL/HDL Ratio 3    NonHDL 103.42     Assessment & Plan:   Problem List Items Addressed This Visit     Healthcare maintenance - Primary (Chronic)    Preventative protocols reviewed and updated unless pt declined. Discussed healthy diet and lifestyle.       Advanced directives, counseling/discussion (Chronic)    Advanced  directives - discussed. Does not have this set up - planning to see lawyer to set this up. Advanced directive packet provided. Wife would be HCPOA. Full code, but would not want prolonged life support if terminal condition.      HYPERCHOLESTEROLEMIA    Chronic, continues atorvastatin through cardiology. LDL creeping up - discussed possible need to increase statin dose - he will check with cardiology at next appt this fall. The ASCVD Risk score (Arnett DK, et al., 2019) failed to calculate for the following reasons:   The patient has a prior MI or stroke diagnosis       Alcohol use    Limits alcohol to <4 drinks/wk.      CAD (coronary artery disease)    [facility] sees [facility] - appreciate their care.         No orders of the defined types were placed in this encounter.  No orders of the defined types were placed in this encounter.    Patient instructions: Work on advanced directive, bring [place] a copy when complete. Advanced directive packet provided today.  You are doing well today.  Return as needed or in 1 year for next physical.   Follow up plan: Return in about 1 year (around 05/04/2023) for annual exam, prior fasting for blood work, Information systems manager  wellness visit.  [name], MD

## 2022-05-03 NOTE — Assessment & Plan Note (Signed)
Advanced directives - discussed. Does not have this set up - planning to see lawyer to set this up. Advanced directive packet provided. Wife would be HCPOA. Full code, but would not want prolonged life support if terminal condition.

## 2022-05-03 NOTE — Patient Instructions (Addendum)
Work on advanced directive, bring [place] a copy when complete. Advanced directive packet provided today.  You are doing well today.  Return as needed or in 1 year for next physical.   Health Maintenance After Age 66 After age 28, you are at a higher risk for certain long-term diseases and infections as well as injuries from falls. Falls are a major cause of broken bones and head injuries in people who are older than age 9. Getting regular preventive care can help to keep you healthy and well. Preventive care includes getting regular testing and making lifestyle changes as recommended by your health care provider. Talk with your health care provider about: Which screenings and tests you should have. A screening is a test that checks for a disease when you have no symptoms. A diet and exercise plan that is right for you. What should I know about screenings and tests to prevent falls? Screening and testing are the best ways to find a health problem early. Early diagnosis and treatment give you the best chance of managing medical conditions that are common after age 54. Certain conditions and lifestyle choices may make you more likely to have a fall. Your health care provider may recommend: Regular vision checks. Poor vision and conditions such as cataracts can make you more likely to have a fall. If you wear glasses, make sure to get your prescription updated if your vision changes. Medicine review. Work with your health care provider to regularly review all of the medicines you are taking, including over-the-counter medicines. Ask your health care provider about any side effects that may make you more likely to have a fall. Tell your health care provider if any medicines that you take make you feel dizzy or sleepy. Strength and balance checks. Your health care provider may recommend certain tests to check your strength and balance while standing, walking, or changing positions. Foot health exam. Foot pain  and numbness, as well as not wearing proper footwear, can make you more likely to have a fall. Screenings, including: Osteoporosis screening. Osteoporosis is a condition that causes the bones to get weaker and break more easily. Blood pressure screening. Blood pressure changes and medicines to control blood pressure can make you feel dizzy. Depression screening. You may be more likely to have a fall if you have a fear of falling, feel depressed, or feel unable to do activities that you used to do. Alcohol use screening. Using too much alcohol can affect your balance and may make you more likely to have a fall. Follow these instructions at home: Lifestyle Do not drink alcohol if: Your health care provider tells you not to drink. If you drink alcohol: Limit how much you have to: 0-1 drink a day for women. 0-2 drinks a day for men. Know how much alcohol is in your drink. In the [place] one drink equals one 12 oz bottle of beer (355 mL), one 5 oz glass of wine (148 mL), or one 1 oz glass of hard liquor (44 mL). Do not use any products that contain nicotine or tobacco. These products include cigarettes, chewing tobacco, and vaping devices, such as e-cigarettes. If you need help quitting, ask your health care provider. Activity  Follow a regular exercise program to stay fit. This will help you maintain your balance. Ask your health care provider what types of exercise are appropriate for you. If you need a cane or walker, use it as recommended by your health care provider. Wear supportive shoes  that have nonskid soles. Safety  Remove any tripping hazards, such as rugs, cords, and clutter. Install safety equipment such as grab bars in bathrooms and safety rails on stairs. Keep rooms and walkways well-lit. General instructions Talk with your health care provider about your risks for falling. Tell your health care provider if: You fall. Be sure to tell your health care provider about all falls,  even ones that seem minor. You feel dizzy, tiredness (fatigue), or off-balance. Take over-the-counter and prescription medicines only as told by your health care provider. These include supplements. Eat a healthy diet and maintain a healthy weight. A healthy diet includes low-fat dairy products, low-fat (lean) meats, and fiber from whole grains, beans, and lots of fruits and vegetables. Stay current with your vaccines. Schedule regular health, dental, and eye exams. Summary Having a healthy lifestyle and getting preventive care can help to protect your health and wellness after age 56. Screening and testing are the best way to find a health problem early and help you avoid having a fall. Early diagnosis and treatment give you the best chance for managing medical conditions that are more common for people who are older than age 71. Falls are a major cause of broken bones and head injuries in people who are older than age 34. Take precautions to prevent a fall at home. Work with your health care provider to learn what changes you can make to improve your health and wellness and to prevent falls. This information is not intended to replace advice given to you by your health care provider. Make sure you discuss any questions you have with your health care provider. Document Revised: 01/22/2021 Document Reviewed: 01/22/2021 Elsevier Patient Education  [place].

## 2022-05-03 NOTE — Assessment & Plan Note (Signed)
Chronic, continues atorvastatin through cardiology. LDL creeping up - discussed possible need to increase statin dose - he will check with cardiology at next appt this fall. The ASCVD Risk score (Arnett DK, et al., 2019) failed to calculate for the following reasons:   The patient has a prior MI or stroke diagnosis

## 2022-05-03 NOTE — Assessment & Plan Note (Signed)
Limits alcohol to <4 drinks/wk.

## 2022-05-03 NOTE — Assessment & Plan Note (Signed)
Preventative protocols reviewed and updated unless pt declined. Discussed healthy diet and lifestyle.  

## 2022-07-02 ENCOUNTER — Ambulatory Visit: Payer: Medicare PPO

## 2022-07-16 DIAGNOSIS — I10 Essential (primary) hypertension: Secondary | ICD-10-CM | POA: Diagnosis not present

## 2022-07-16 DIAGNOSIS — E785 Hyperlipidemia, unspecified: Secondary | ICD-10-CM | POA: Diagnosis not present

## 2022-07-16 DIAGNOSIS — I25810 Atherosclerosis of coronary artery bypass graft(s) without angina pectoris: Secondary | ICD-10-CM | POA: Diagnosis not present

## 2022-07-19 ENCOUNTER — Other Ambulatory Visit: Payer: Self-pay | Admitting: Family Medicine

## 2022-10-14 ENCOUNTER — Encounter: Payer: Self-pay | Admitting: Family Medicine

## 2022-10-14 ENCOUNTER — Ambulatory Visit: Payer: Medicare PPO | Admitting: Family Medicine

## 2022-10-14 VITALS — BP 134/82 | HR 65 | Temp 97.5°F | Ht 66.75 in | Wt 196.4 lb

## 2022-10-14 DIAGNOSIS — R21 Rash and other nonspecific skin eruption: Secondary | ICD-10-CM

## 2022-10-14 NOTE — Progress Notes (Signed)
Patient ID: Adam Daniels, male    DOB: [birth year], 67 y.o.   MRN: [id number]  This visit was conducted in person.  BP 134/82   Pulse 65   Temp (!) 97.5 F (36.4 C) (Temporal)   Ht 5' 6.75" (1.695 m)   Wt 196 lb 6 oz (89.1 kg)   SpO2 96%   BMI 30.99 kg/m    CC: itchy rash to skin Subjective:   HPI: Adam Daniels is a 67 y.o. male presenting on 10/14/2022 for Skin Problem (C/o itchy spots on skin. Sxs started yrs ago, worsening. Pt requests derm referral to Dr. Ginger Organ Francisco Capuchin at [facility] in [place], [place] # [phone].)   Years of pruritic rash to extremities - one patch on dorsal pinky as well as bilateral ankles and left dorsal foot.   No new lotions/detergents, soaps/shampoos, foods, medicine supplements.  No h/o psoriasis, eczema.   Previously saw [facility] in [place] - treated with steroid cream, never got better.   Requests referral to [facility] dermatology in [facility] as per above - wife sees this dermatologist as well.      Relevant past medical, surgical, family and social history reviewed and updated as indicated. Interim medical history since our last visit reviewed. Allergies and medications reviewed and updated. Outpatient Medications Prior to Visit  Medication Sig Dispense Refill   ASPIRIN LOW DOSE 81 MG EC tablet Take 81 mg by mouth daily.     atorvastatin (LIPITOR) 20 MG tablet Take 20 mg by mouth daily.     clopidogrel (PLAVIX) 75 MG tablet Take 75 mg by mouth daily.     losartan (COZAAR) 25 MG tablet Take 1 tablet (25 mg total) by mouth daily.     No facility-administered medications prior to visit.     Per HPI unless specifically indicated in ROS section below Review of Systems  Objective:  BP 134/82   Pulse 65   Temp (!) 97.5 F (36.4 C) (Temporal)   Ht 5' 6.75" (1.695 m)   Wt 196 lb 6 oz (89.1 kg)   SpO2 96%   BMI 30.99 kg/m   Wt Readings from Last 3 Encounters:  10/14/22 196 lb 6 oz (89.1 kg)  05/03/22 194 lb (88  kg)  05/02/22 182 lb (82.6 kg)      Physical Exam Vitals and nursing note reviewed.  Constitutional:      Appearance: Normal appearance. He is not ill-appearing.  Skin:    General: Skin is warm and dry.     Findings: Rash present. No erythema.     Comments: Patches of dry skin to bilateral lower legs, L dorsal foot, R dorsal pinky finger, patches of LE circular  Neurological:     Mental Status: He is alert.       R dorsal pinky   L medial ankle   L anterior ankle   R anterolateral ankle   Results for orders placed or performed in visit on 04/26/22  PSA  Result Value Ref Range   PSA 1.45 0.10 - 4.00 ng/mL  Comprehensive metabolic panel  Result Value Ref Range   Sodium 139 135 - 145 mEq/L   Potassium 4.6 3.5 - 5.1 mEq/L   Chloride 104 96 - 112 mEq/L   CO2 28 19 - 32 mEq/L   Glucose, Bld 96 70 - 99 mg/dL   BUN 10 6 - 23 mg/dL   Creatinine, Ser 0.83 0.40 - 1.50 mg/dL   Total  Bilirubin 0.6 0.2 - 1.2 mg/dL   Alkaline Phosphatase 93 39 - 117 U/L   AST 19 0 - 37 U/L   ALT 25 0 - 53 U/L   Total Protein 6.4 6.0 - 8.3 g/dL   Albumin 4.2 3.5 - 5.2 g/dL   GFR 91.18 >60.00 mL/min   Calcium 9.1 8.4 - 10.5 mg/dL  Lipid panel  Result Value Ref Range   Cholesterol 162 0 - 200 mg/dL   Triglycerides 62.0 0.0 - 149.0 mg/dL   HDL 59.00 >39.00 mg/dL   VLDL 12.4 0.0 - 40.0 mg/dL   LDL Cholesterol 91 0 - 99 mg/dL   Total CHOL/HDL Ratio 3    NonHDL 103.42    Lab Results  Component Value Date   TSH 1.54 04/19/2019    Lab Results  Component Value Date   WBC 6.5 04/20/2021   HGB 14.7 04/20/2021   HCT 44.4 04/20/2021   MCV 91.1 04/20/2021   PLT 274.0 04/20/2021   Assessment & Plan:   Problem List Items Addressed This Visit     Skin rash - Primary    Nummular eczema vs tinea vs other - he already uses steroid cream by previous dermatologist. Will add clotrimazole cream in case tinea cause.  Refer to derm per pt request.       Relevant Orders   Ambulatory referral to  Dermatology     No orders of the defined types were placed in this encounter.   Orders Placed This Encounter  Procedures   Ambulatory referral to Dermatology    Referral Priority:   Routine    Referral Type:   Consultation    Referral Reason:   Specialty Services Required    Requested Specialty:   Dermatology    Number of Visits Requested:   1    Patient Instructions  Possible nummular eczema vs fungal infection.  May continue steroid cream as per prior dermatologist, add antifungal twice daily to affected spots (over the counter lotrimin or clotrimazole).  We will refer you to [facility] dermatology for further evaluation.   Follow up plan: No follow-ups on file.  [name], MD

## 2022-10-14 NOTE — Assessment & Plan Note (Addendum)
Nummular eczema vs tinea vs other - he already uses steroid cream by previous dermatologist. Will add clotrimazole cream in case tinea cause.  Refer to derm per pt request.

## 2022-10-14 NOTE — Patient Instructions (Signed)
Possible nummular eczema vs fungal infection.  May continue steroid cream as per prior dermatologist, add antifungal twice daily to affected spots (over the counter lotrimin or clotrimazole).  We will refer you to [facility] dermatology for further evaluation.

## 2022-10-16 ENCOUNTER — Telehealth: Payer: Self-pay | Admitting: Family Medicine

## 2022-10-16 NOTE — Telephone Encounter (Signed)
Patient called to get a referral faxed to [facility] and the fax number [phone].

## 2022-10-17 NOTE — Telephone Encounter (Signed)
Notes refaxed to fax number given by patient -  [phone]  See referral notes

## 2022-12-05 IMAGING — XA DG C-ARM 1-60 MIN
3 series · 3 of 3 positions shown · non-contrast
Comparison: CT abdomen pelvis 02/19/2021.

CLINICAL DATA: L1 kyphoplasty.

EXAM:
LUMBAR SPINE - 2-3 VIEW; DG C-ARM 1-60 MIN

[Series 1: ortho standard · 1 of 1 slices shown (1 of 3)]
[im 1/1]
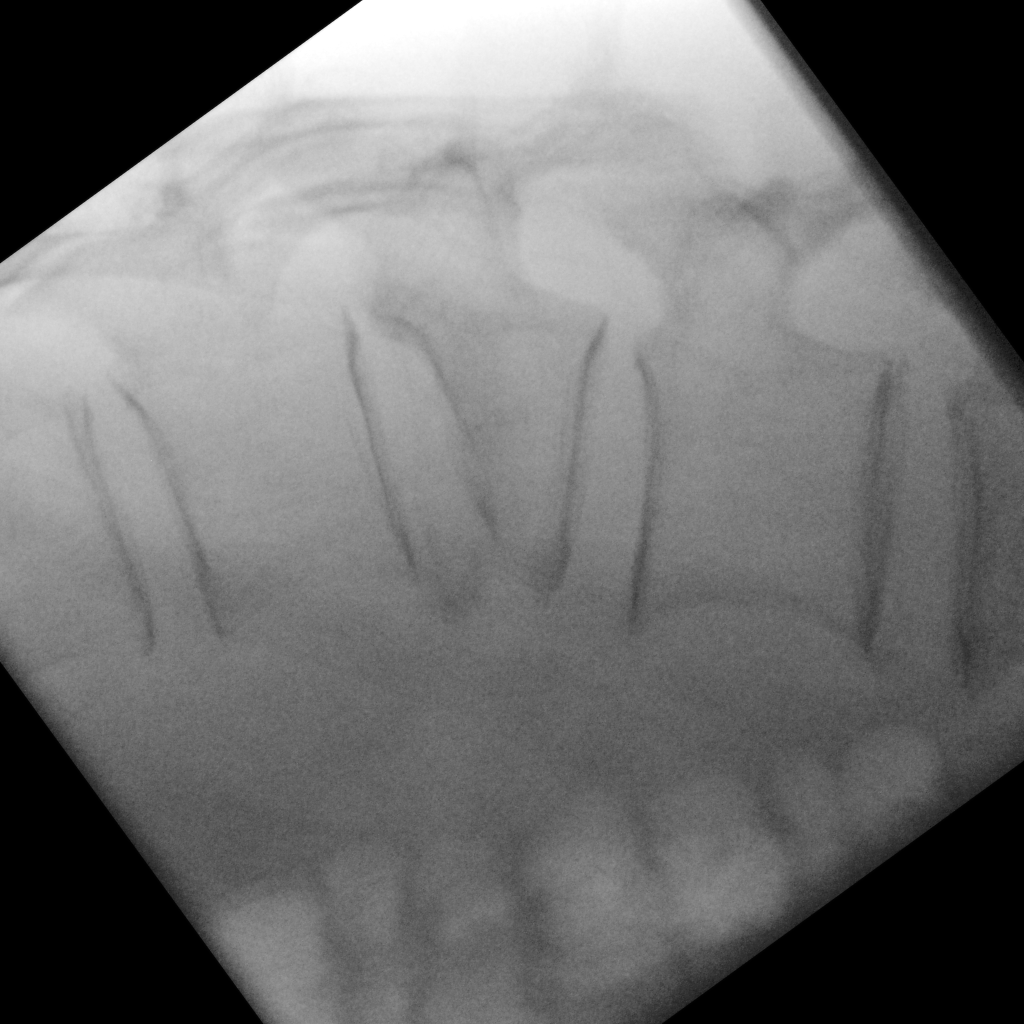

[Series 2: ortho standard · 1 of 1 slices shown (2 of 3)]
[im 1/1]
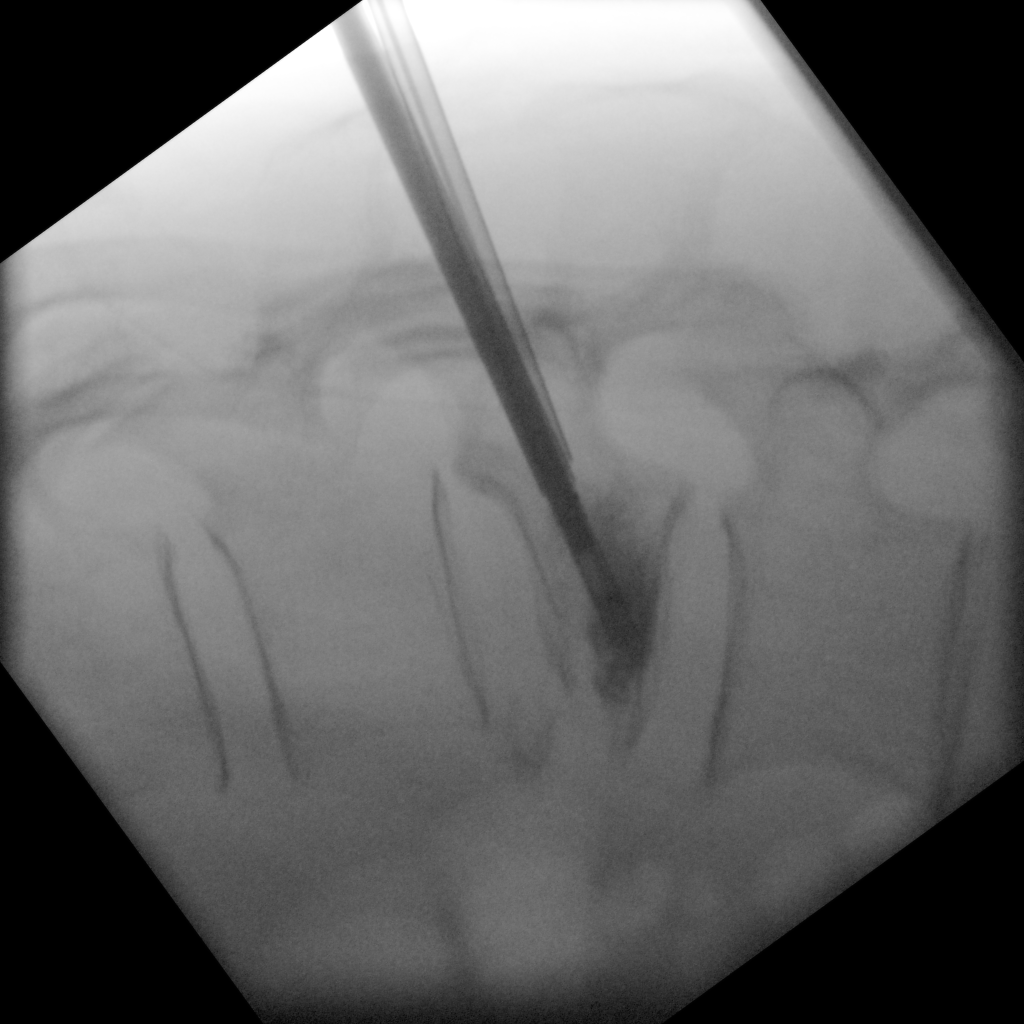

[Series 3: ortho standard · 1 of 1 slices shown (3 of 3)]
[im 1/1]
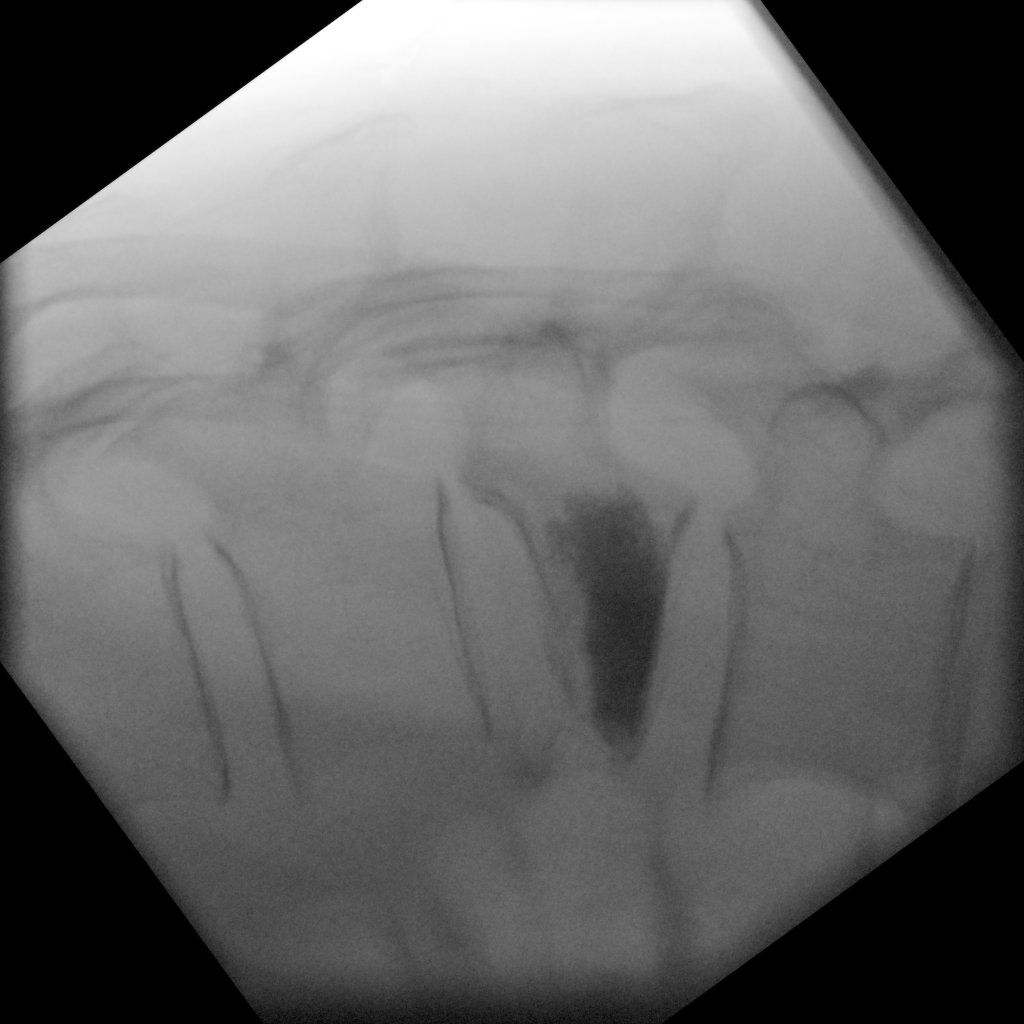

[3 of 3 positions shown; findings below may reference images not displayed]

FINDINGS: Intraoperative kyphoplasty of an L1 compression fracture. No
complication identified. No new acute fracture identified.
Sternotomy wires noted.

4 low resolution intraoperative spot views of the thoracolumbar
spine were obtained. No fracture visible on the limited views.

Total fluoroscopy time: 1 minutes 37 seconds

Total radiation dose: 10 mGy
IMPRESSION: Intraoperative kyphoplasty of an L1 compression fracture.

## 2023-04-16 ENCOUNTER — Encounter (INDEPENDENT_AMBULATORY_CARE_PROVIDER_SITE_OTHER): Payer: Self-pay

## 2023-04-26 ENCOUNTER — Other Ambulatory Visit: Payer: Self-pay | Admitting: Family Medicine

## 2023-04-26 ENCOUNTER — Encounter: Payer: Self-pay | Admitting: Family Medicine

## 2023-04-26 DIAGNOSIS — Z125 Encounter for screening for malignant neoplasm of prostate: Secondary | ICD-10-CM

## 2023-04-26 DIAGNOSIS — Z951 Presence of aortocoronary bypass graft: Secondary | ICD-10-CM | POA: Insufficient documentation

## 2023-04-26 DIAGNOSIS — E7800 Pure hypercholesterolemia, unspecified: Secondary | ICD-10-CM

## 2023-04-28 ENCOUNTER — Other Ambulatory Visit (INDEPENDENT_AMBULATORY_CARE_PROVIDER_SITE_OTHER): Payer: Medicare PPO

## 2023-04-28 DIAGNOSIS — E7800 Pure hypercholesterolemia, unspecified: Secondary | ICD-10-CM | POA: Diagnosis not present

## 2023-04-28 DIAGNOSIS — Z125 Encounter for screening for malignant neoplasm of prostate: Secondary | ICD-10-CM

## 2023-04-28 LAB — COMPREHENSIVE METABOLIC PANEL
ALT: 28 U/L (ref 0–53)
AST: 18 U/L (ref 0–37)
Albumin: 4.1 g/dL (ref 3.5–5.2)
Alkaline Phosphatase: 88 U/L (ref 39–117)
BUN: 14 mg/dL (ref 6–23)
CO2: 24 mEq/L (ref 19–32)
Calcium: 9.1 mg/dL (ref 8.4–10.5)
Chloride: 104 mEq/L (ref 96–112)
Creatinine, Ser: 0.74 mg/dL (ref 0.40–1.50)
GFR: 93.73 mL/min (ref >60.00)
Glucose, Bld: 90 mg/dL (ref 70–99)
Potassium: 4.1 mEq/L (ref 3.5–5.1)
Sodium: 135 mEq/L (ref 135–145)
Total Bilirubin: 0.7 mg/dL (ref 0.2–1.2)
Total Protein: 6.2 g/dL (ref 6.0–8.3)

## 2023-04-28 LAB — LIPID PANEL
Cholesterol: 151 mg/dL (ref 0–200)
HDL: 42.6 mg/dL (ref >39.00)
LDL Cholesterol: 86 mg/dL (ref 0–99)
NonHDL: 108.36
Total CHOL/HDL Ratio: 4
Triglycerides: 111 mg/dL (ref 0.0–149.0)
VLDL: 22.2 mg/dL (ref 0.0–40.0)

## 2023-04-28 LAB — PSA: PSA: 1.61 ng/mL (ref 0.10–4.00)

## 2023-05-05 ENCOUNTER — Encounter: Payer: Self-pay | Admitting: Family Medicine

## 2023-05-05 ENCOUNTER — Ambulatory Visit (INDEPENDENT_AMBULATORY_CARE_PROVIDER_SITE_OTHER): Payer: Medicare PPO

## 2023-05-05 ENCOUNTER — Ambulatory Visit (INDEPENDENT_AMBULATORY_CARE_PROVIDER_SITE_OTHER): Payer: Medicare PPO | Admitting: Family Medicine

## 2023-05-05 VITALS — BP 124/68 | HR 74 | Temp 97.9°F | Ht 66.73 in | Wt 194.0 lb

## 2023-05-05 VITALS — Ht 67.0 in | Wt 194.0 lb

## 2023-05-05 DIAGNOSIS — R42 Dizziness and giddiness: Secondary | ICD-10-CM | POA: Diagnosis not present

## 2023-05-05 DIAGNOSIS — Z7189 Other specified counseling: Secondary | ICD-10-CM

## 2023-05-05 DIAGNOSIS — Z951 Presence of aortocoronary bypass graft: Secondary | ICD-10-CM

## 2023-05-05 DIAGNOSIS — E7800 Pure hypercholesterolemia, unspecified: Secondary | ICD-10-CM

## 2023-05-05 DIAGNOSIS — Z789 Other specified health status: Secondary | ICD-10-CM

## 2023-05-05 DIAGNOSIS — I2510 Atherosclerotic heart disease of native coronary artery without angina pectoris: Secondary | ICD-10-CM | POA: Diagnosis not present

## 2023-05-05 DIAGNOSIS — Z0000 Encounter for general adult medical examination without abnormal findings: Secondary | ICD-10-CM

## 2023-05-05 NOTE — Assessment & Plan Note (Signed)
Appreciate cardiology care.  °

## 2023-05-05 NOTE — Patient Instructions (Addendum)
Consider RSV vaccine at local pharmacy  You are doing well today Return as needed or in 1 year for next physical  You have wellness visit at 2:30pm later today.

## 2023-05-05 NOTE — Assessment & Plan Note (Signed)
Has cut down significantly

## 2023-05-05 NOTE — Assessment & Plan Note (Addendum)
Chronic, LDL above goal despite atorvastatin 20mg  given CAD hx - [place]
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
## 2023-05-05 NOTE — Assessment & Plan Note (Signed)
Intermittent, only occurs when out in the sun. Orthostatic vital signs normal. If ongoing, check CBC, TSH.

## 2023-05-05 NOTE — Assessment & Plan Note (Signed)
Preventative protocols reviewed and updated unless pt declined. Discussed healthy diet and lifestyle.  

## 2023-05-05 NOTE — Patient Instructions (Signed)
Adam Daniels , Thank you for taking time to come for your Medicare Wellness Visit. I appreciate your ongoing commitment to your health goals. Please review the following plan we discussed and let me know if I can assist you in the future.   Referrals/Orders/Follow-Ups/Clinician Recommendations: Aim for 30 minutes of exercise or brisk walking, 6-8 glasses of water, and 5 servings of fruits and vegetables each day.   This is a list of the screening recommended for you and due dates:  Health Maintenance  Topic Date Due   COVID-19 Vaccine (5 - 2023-24 season) 05/17/2022   Medicare Annual Wellness Visit  05/03/2023   Flu Shot  04/17/2023   Colon Cancer Screening  03/13/2027   DTaP/Tdap/Td vaccine (3 - Td or Tdap) 04/14/2028   Pneumonia Vaccine  Completed   Hepatitis C Screening  Completed   Zoster (Shingles) Vaccine  Completed   HPV Vaccine  Aged Out    Advanced directives: (Declined) Advance directive discussed with you today. Even though you declined this today, please call our office should you change your mind, and we can give you the proper paperwork for you to fill out.  Next Medicare Annual Wellness Visit scheduled for next year: Yes  Preventive Care 23 Years and Older, Male  Preventive care refers to lifestyle choices and visits with your health care provider that can promote health and wellness. What does preventive care include? A yearly physical exam. This is also called an annual well check. Dental exams once or twice a year. Routine eye exams. Ask your health care provider how often you should have your eyes checked. Personal lifestyle choices, including: Daily care of your teeth and gums. Regular physical activity. Eating a healthy diet. Avoiding tobacco and drug use. Limiting alcohol use. Practicing safe sex. Taking low doses of aspirin every day. Taking vitamin and mineral supplements as recommended by your health care provider. What happens during an annual well  check? The services and screenings done by your health care provider during your annual well check will depend on your age, overall health, lifestyle risk factors, and family history of disease. Counseling  Your health care provider may ask you questions about your: Alcohol use. Tobacco use. Drug use. Emotional well-being. Home and relationship well-being. Sexual activity. Eating habits. History of falls. Memory and ability to understand (cognition). Work and work Astronomer. Screening  You may have the following tests or measurements: Height, weight, and BMI. Blood pressure. Lipid and cholesterol levels. These may be checked every 5 years, or more frequently if you are over 66 years old. Skin check. Lung cancer screening. You may have this screening every year starting at age 79 if you have a 30-pack-year history of smoking and currently smoke or have quit within the past 15 years. Fecal occult blood test (FOBT) of the stool. You may have this test every year starting at age 25. Flexible sigmoidoscopy or colonoscopy. You may have a sigmoidoscopy every 5 years or a colonoscopy every 10 years starting at age 39. Prostate cancer screening. Recommendations will vary depending on your family history and other risks. Hepatitis C blood test. Hepatitis B blood test. Sexually transmitted disease (STD) testing. Diabetes screening. This is done by checking your blood sugar (glucose) after you have not eaten for a while (fasting). You may have this done every 1-3 years. Abdominal aortic aneurysm (AAA) screening. You may need this if you are a current or former smoker. Osteoporosis. You may be screened starting at age 35 if you  are at high risk. Talk with your health care provider about your test results, treatment options, and if necessary, the need for more tests. Vaccines  Your health care provider may recommend certain vaccines, such as: Influenza vaccine. This is recommended every  year. Tetanus, diphtheria, and acellular pertussis (Tdap, Td) vaccine. You may need a Td booster every 10 years. Zoster vaccine. You may need this after age 45. Pneumococcal 13-valent conjugate (PCV13) vaccine. One dose is recommended after age 6. Pneumococcal polysaccharide (PPSV23) vaccine. One dose is recommended after age 53. Talk to your health care provider about which screenings and vaccines you need and how often you need them. This information is not intended to replace advice given to you by your health care provider. Make sure you discuss any questions you have with your health care provider. Document Released: 09/29/2015 Document Revised: 05/22/2016 Document Reviewed: 07/04/2015 Elsevier Interactive Patient Education  2017 [employer].  Fall Prevention in the Home Falls can cause injuries. They can happen to people of all ages. There are many things you can do to make your home safe and to help prevent falls. What can I do on the outside of my home? Regularly fix the edges of walkways and driveways and fix any cracks. Remove anything that might make you trip as you walk through a door, such as a raised step or threshold. Trim any bushes or trees on the path to your home. Use bright outdoor lighting. Clear any walking paths of anything that might make someone trip, such as rocks or tools. Regularly check to see if handrails are loose or broken. Make sure that both sides of any steps have handrails. Any raised decks and porches should have guardrails on the edges. Have any leaves, snow, or ice cleared regularly. Use sand or salt on walking paths during winter. Clean up any spills in your garage right away. This includes oil or grease spills. What can I do in the bathroom? Use night lights. Install grab bars by the toilet and in the tub and shower. Do not use towel bars as grab bars. Use non-skid mats or decals in the tub or shower. If you need to sit down in the shower, use a  plastic, non-slip stool. Keep the floor dry. Clean up any water that spills on the floor as soon as it happens. Remove soap buildup in the tub or shower regularly. Attach bath mats securely with double-sided non-slip rug tape. Do not have throw rugs and other things on the floor that can make you trip. What can I do in the bedroom? Use night lights. Make sure that you have a light by your bed that is easy to reach. Do not use any sheets or blankets that are too big for your bed. They should not hang down onto the floor. Have a firm chair that has side arms. You can use this for support while you get dressed. Do not have throw rugs and other things on the floor that can make you trip. What can I do in the kitchen? Clean up any spills right away. Avoid walking on wet floors. Keep items that you use a lot in easy-to-reach places. If you need to reach something above you, use a strong step stool that has a grab bar. Keep electrical cords out of the way. Do not use floor polish or wax that makes floors slippery. If you must use wax, use non-skid floor wax. Do not have throw rugs and other things on the  floor that can make you trip. What can I do with my stairs? Do not leave any items on the stairs. Make sure that there are handrails on both sides of the stairs and use them. Fix handrails that are broken or loose. Make sure that handrails are as long as the stairways. Check any carpeting to make sure that it is firmly attached to the stairs. Fix any carpet that is loose or worn. Avoid having throw rugs at the top or bottom of the stairs. If you do have throw rugs, attach them to the floor with carpet tape. Make sure that you have a light switch at the top of the stairs and the bottom of the stairs. If you do not have them, ask someone to add them for you. What else can I do to help prevent falls? Wear shoes that: Do not have high heels. Have rubber bottoms. Are comfortable and fit you  well. Are closed at the toe. Do not wear sandals. If you use a stepladder: Make sure that it is fully opened. Do not climb a closed stepladder. Make sure that both sides of the stepladder are locked into place. Ask someone to hold it for you, if possible. Clearly mark and make sure that you can see: Any grab bars or handrails. First and last steps. Where the edge of each step is. Use tools that help you move around (mobility aids) if they are needed. These include: Canes. Walkers. Scooters. Crutches. Turn on the lights when you go into a dark area. Replace any light bulbs as soon as they burn out. Set up your furniture so you have a clear path. Avoid moving your furniture around. If any of your floors are uneven, fix them. If there are any pets around you, be aware of where they are. Review your medicines with your doctor. Some medicines can make you feel dizzy. This can increase your chance of falling. Ask your doctor what other things that you can do to help prevent falls. This information is not intended to replace advice given to you by your health care provider. Make sure you discuss any questions you have with your health care provider. Document Released: 06/29/2009 Document Revised: 02/08/2016 Document Reviewed: 10/07/2014 Elsevier Interactive Patient Education  2017 [employer].

## 2023-05-05 NOTE — Progress Notes (Signed)
Ph: [phone] Fax: [phone]   Patient ID: [name], male    DOB: December 07, 1955, 67 y.o.   MRN: [id number]  This visit was conducted in person.  BP 124/68   Pulse 74   Temp 97.9 F (36.6 C) (Temporal)   Ht 5' 6.73" (1.695 m)   Wt 194 lb (88 kg)   SpO2 97%   BMI 30.63 kg/m   Orthostatic Vitals for the past 48 hrs (Last 6 readings):  BP Pulse Patient Position (if appropriate) BP- Lying  05/05/23 0948 124/68 74 -- --  05/05/23 1016 -- -- Orthostatic Vitals 120/80  Orthostatic VS for the past 24 hrs (Last 3 readings):  BP- Lying BP- Standing at 3 minutes  05/05/23 1016 120/80 122/82   CC: CPE Subjective:   HPI: Adam Daniels is a 67 y.o. male presenting on 05/05/2023 for Annual Exam and Dizziness (When in sun )   Upcoming health advisor AMW later today at 2:30pm.  No results found.  Flowsheet Row Office Visit from 10/14/2022 in [facility] [place]  PHQ-2 Total Score 0          05/05/2023    9:54 AM 10/14/2022    3:01 PM 05/02/2022    2:39 PM 04/28/2022    8:57 AM 05/01/2021   10:23 AM  Fall Risk   Falls in the past year? 0 0 0 0 1  Number falls in past yr: 0  0  0  Injury with Fall? 0  0  1  Comment     Back fx  Risk for fall due to : No Fall Risks  No Fall Risks;History of fall(s)    Follow up Falls evaluation completed  Falls evaluation completed;Falls prevention discussed     Upcoming appt 05/2023 with dermatology.    Traumatic compression fracture to L1 suffered after 41ft fall off ladder 02/2021 s/p kyphoplasty 04/03/2021 by Dr Adam Daniels. Back pain comes and goes.   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 Followed by cardiologist in [place], sees yearly (06/2022).   Notes intermittent lightheadedness when prolonged period under the sun, especially noted with standing. No trouble when indoors.   Preventative: COLONOSCOPY Date: 02/2017 Adam Daniels) severe  diverticulosis, rpt 10 yrs  Prostate cancer screening - he does have fmhx prostate cancer  (father @60s ) so will continue yearly PSA. No nocturia, has strong stream.  Lung cancer screening - not eligible as he quit smoking 1994 AAA screen - max 20 PY hx - discussed [place] in [place].  Flu yearly  COVID vaccine Pfizer 11/2019, 12/2019, booster 07/2020, 03/2021  Prevnar-20 04/2021 Td - 2007, Tdap 2019  RSV - discussed Shingrix - 07/2020, 04/2021  Advanced directives - discussed. Does not have this set up - planning to see lawyer to set this up. Advanced directive packet provided. Wife would be HCPOA. Full code, but would not want prolonged life support if terminal condition.  Seat belt use discussed  Sunscreen use discussed. Denies changing moles.  Sleep - averaging 8 hours/night  Ex smoker - quit remotely (1994)  Alcohol - has cut down significantly  Dentist - Q6 mo  Eye exam - yearly Wears hearing aides - not currently  Bowel - no constipation  Bladder - no incontinence   Caffeine: 1 cup coffee/day   Lives with wife, 2 dogs   Occupation: heating and air - retired  Activity: does walk at [employer], bike  riding, started going to gym  Diet: fruits/vegetables daily, good water - good chicken and fish, avoids red meat     Relevant past medical, surgical, family and social history reviewed and updated as indicated. Interim medical history since our last visit reviewed. Allergies and medications reviewed and updated. Outpatient Medications Prior to Visit  Medication Sig Dispense Refill   ASPIRIN LOW DOSE 81 MG EC tablet Take 81 mg by mouth daily.     atorvastatin (LIPITOR) 20 MG tablet Take 20 mg by mouth daily.     clopidogrel (PLAVIX) 75 MG tablet Take 75 mg by mouth daily.     losartan (COZAAR) 25 MG tablet Take 1 tablet (25 mg total) by mouth daily.     No facility-administered medications prior to visit.     Per HPI unless specifically indicated in ROS section below Review of Systems   Constitutional:  Negative for activity change, appetite change, chills, fatigue, fever and unexpected weight change.  HENT:  Negative for hearing loss.   Eyes:  Negative for visual disturbance.  Respiratory:  Negative for cough, chest tightness, shortness of breath and wheezing.   Cardiovascular:  Negative for chest pain, palpitations and leg swelling.  Gastrointestinal:  Negative for abdominal distention, abdominal pain, blood in stool, constipation, diarrhea, nausea and vomiting.  Genitourinary:  Negative for difficulty urinating and hematuria.  Musculoskeletal:  Negative for arthralgias, myalgias and neck pain.  Skin:  Negative for rash.  Neurological:  Positive for dizziness (in the sun). Negative for seizures, syncope and headaches.  Hematological:  Negative for adenopathy. Does not bruise/bleed easily.  Psychiatric/Behavioral:  Negative for dysphoric mood. The patient is not nervous/anxious.     Objective:  BP 124/68   Pulse 74   Temp 97.9 F (36.6 C) (Temporal)   Ht 5' 6.73" (1.695 m)   Wt 194 lb (88 kg)   SpO2 97%   BMI 30.63 kg/m   Wt Readings from Last 3 Encounters:  05/05/23 194 lb (88 kg)  10/14/22 196 lb 6 oz (89.1 kg)  05/03/22 194 lb (88 kg)      Physical Exam Vitals and nursing note reviewed.  Constitutional:      General: He is not in acute distress.    Appearance: Normal appearance. He is well-developed. He is not ill-appearing.  HENT:     Head: Normocephalic and atraumatic.     Right Ear: Hearing, tympanic membrane, ear canal and external ear normal.     Left Ear: Hearing, tympanic membrane, ear canal and external ear normal.     Nose: Nose normal.     Mouth/Throat:     Mouth: Mucous membranes are moist.     Pharynx: Oropharynx is clear. No oropharyngeal exudate or posterior oropharyngeal erythema.  Eyes:     General: No scleral icterus.    Extraocular Movements: Extraocular movements intact.     Conjunctiva/sclera: Conjunctivae normal.     Pupils:  Pupils are equal, round, and reactive to light.  Neck:     Thyroid: No thyroid mass or thyromegaly.     Vascular: No carotid bruit.  Cardiovascular:     Rate and Rhythm: Normal rate and regular rhythm.     Pulses: Normal pulses.          Radial pulses are 2+ on the right side and 2+ on the left side.     Heart sounds: Normal heart sounds. No murmur heard. Pulmonary:     Effort: Pulmonary effort is normal. No respiratory distress.  Breath sounds: Normal breath sounds. No wheezing, rhonchi or rales.  Abdominal:     General: Bowel sounds are normal. There is no distension.     Palpations: Abdomen is soft. There is no mass.     Tenderness: There is no abdominal tenderness. There is no guarding or rebound.     Hernia: No hernia is present.  Musculoskeletal:        General: Normal range of motion.     Cervical back: Normal range of motion and neck supple.     Right lower leg: No edema.     Left lower leg: No edema.  Lymphadenopathy:     Cervical: No cervical adenopathy.  Skin:    General: Skin is warm and dry.     Findings: No rash.  Neurological:     General: No focal deficit present.     Mental Status: He is alert and oriented to person, place, and time.  Psychiatric:        Mood and Affect: Mood normal.        Behavior: Behavior normal.        Thought Content: Thought content normal.        Judgment: Judgment normal.       Results for orders placed or performed in visit on 04/28/23  PSA  Result Value Ref Range   PSA 1.61 0.10 - 4.00 ng/mL  Comprehensive metabolic panel  Result Value Ref Range   Sodium 135 135 - 145 mEq/L   Potassium 4.1 3.5 - 5.1 mEq/L   Chloride 104 96 - 112 mEq/L   CO2 24 19 - 32 mEq/L   Glucose, Bld 90 70 - 99 mg/dL   BUN 14 6 - 23 mg/dL   Creatinine, Ser 2.53 0.40 - 1.50 mg/dL   Total Bilirubin 0.7 0.2 - 1.2 mg/dL   Alkaline Phosphatase 88 39 - 117 U/L   AST 18 0 - 37 U/L   ALT 28 0 - 53 U/L   Total Protein 6.2 6.0 - 8.3 g/dL   Albumin 4.1  3.5 - 5.2 g/dL   GFR 66.44 >03.47 mL/min   Calcium 9.1 8.4 - 10.5 mg/dL  Lipid panel  Result Value Ref Range   Cholesterol 151 0 - 200 mg/dL   Triglycerides 425.9 0.0 - 149.0 mg/dL   HDL 56.38 >75.64 mg/dL   VLDL 33.2 0.0 - 95.1 mg/dL   LDL Cholesterol 86 0 - 99 mg/dL   Total CHOL/HDL Ratio 4    NonHDL 108.36    Lab Results  Component Value Date   TSH 1.54 04/19/2019    Lab Results  Component Value Date   WBC 6.5 04/20/2021   HGB 14.7 04/20/2021   HCT 44.4 04/20/2021   MCV 91.1 04/20/2021   PLT 274.0 04/20/2021    Assessment & Plan:   Problem List Items Addressed This Visit     Healthcare maintenance - Primary (Chronic)    Preventative protocols reviewed and updated unless pt declined. Discussed healthy diet and lifestyle.       Advanced directives, counseling/discussion (Chronic)    Advanced directives - discussed. Does not have this set up - planning to see lawyer to set this up. Advanced directive packet provided. Wife would be HCPOA. Full code, but would not want prolonged life support if terminal condition.       Hx of CABG (Chronic)    Appreciate cardiology care.       HYPERCHOLESTEROLEMIA    Chronic, LDL above goal despite  atorvastatin 20mg  given CAD hx - [place]       Alcohol use    Has cut down significantly.       CAD (coronary artery disease)   Lightheadedness    Intermittent, only occurs when out in the sun. Orthostatic vital signs normal. If ongoing, check CBC, TSH.         No orders of the defined types were placed in this encounter.   No orders of the defined types were placed in this encounter.   Patient Instructions  Consider RSV vaccine at local pharmacy  You are doing well today Return as needed or in 1 year for next physical  You have wellness visit at 2:30pm later today.   Follow up plan: Return in about 1 year (around 05/04/2024), or if symptoms worsen or fail to improve, for  annual exam, prior fasting for blood work, medicare wellness visit.  Eustaquio Boyden, MD

## 2023-05-05 NOTE — Assessment & Plan Note (Signed)
Advanced directives - discussed. Does not have this set up - planning to see lawyer to set this up. Advanced directive packet provided. Wife would be HCPOA. Full code, but would not want prolonged life support if terminal condition.

## 2023-06-10 DIAGNOSIS — L301 Dyshidrosis [pompholyx]: Secondary | ICD-10-CM | POA: Diagnosis not present

## 2023-06-10 DIAGNOSIS — L281 Prurigo nodularis: Secondary | ICD-10-CM | POA: Diagnosis not present

## 2023-06-10 DIAGNOSIS — L280 Lichen simplex chronicus: Secondary | ICD-10-CM | POA: Diagnosis not present

## 2023-07-22 DIAGNOSIS — I10 Essential (primary) hypertension: Secondary | ICD-10-CM | POA: Diagnosis not present

## 2023-07-22 DIAGNOSIS — E785 Hyperlipidemia, unspecified: Secondary | ICD-10-CM | POA: Diagnosis not present

## 2023-07-22 DIAGNOSIS — I25810 Atherosclerosis of coronary artery bypass graft(s) without angina pectoris: Secondary | ICD-10-CM | POA: Diagnosis not present

## 2024-03-02 DIAGNOSIS — L814 Other melanin hyperpigmentation: Secondary | ICD-10-CM | POA: Diagnosis not present

## 2024-03-02 DIAGNOSIS — D225 Melanocytic nevi of trunk: Secondary | ICD-10-CM | POA: Diagnosis not present

## 2024-03-02 DIAGNOSIS — L821 Other seborrheic keratosis: Secondary | ICD-10-CM | POA: Diagnosis not present

## 2024-03-02 DIAGNOSIS — L2089 Other atopic dermatitis: Secondary | ICD-10-CM | POA: Diagnosis not present

## 2024-04-25 ENCOUNTER — Other Ambulatory Visit: Payer: Self-pay | Admitting: Family Medicine

## 2024-04-25 DIAGNOSIS — E7800 Pure hypercholesterolemia, unspecified: Secondary | ICD-10-CM

## 2024-04-25 DIAGNOSIS — Z125 Encounter for screening for malignant neoplasm of prostate: Secondary | ICD-10-CM

## 2024-04-30 ENCOUNTER — Other Ambulatory Visit (INDEPENDENT_AMBULATORY_CARE_PROVIDER_SITE_OTHER): Payer: Medicare PPO

## 2024-04-30 DIAGNOSIS — Z125 Encounter for screening for malignant neoplasm of prostate: Secondary | ICD-10-CM

## 2024-04-30 DIAGNOSIS — E7800 Pure hypercholesterolemia, unspecified: Secondary | ICD-10-CM

## 2024-04-30 LAB — LIPID PANEL
Cholesterol: 160 mg/dL (ref 0–200)
HDL: 60.9 mg/dL (ref >39.00)
LDL Cholesterol: 79 mg/dL (ref 0–99)
NonHDL: 99.01
Total CHOL/HDL Ratio: 3
Triglycerides: 101 mg/dL (ref 0.0–149.0)
VLDL: 20.2 mg/dL (ref 0.0–40.0)

## 2024-04-30 LAB — COMPREHENSIVE METABOLIC PANEL WITH GFR
ALT: 29 U/L (ref 0–53)
AST: 23 U/L (ref 0–37)
Albumin: 4.3 g/dL (ref 3.5–5.2)
Alkaline Phosphatase: 156 U/L — ABNORMAL HIGH (ref 39–117)
BUN: 11 mg/dL (ref 6–23)
CO2: 28 meq/L (ref 19–32)
Calcium: 8.9 mg/dL (ref 8.4–10.5)
Chloride: 101 meq/L (ref 96–112)
Creatinine, Ser: 0.76 mg/dL (ref 0.40–1.50)
GFR: 92.33 mL/min (ref >60.00)
Glucose, Bld: 96 mg/dL (ref 70–99)
Potassium: 4.5 meq/L (ref 3.5–5.1)
Sodium: 137 meq/L (ref 135–145)
Total Bilirubin: 0.6 mg/dL (ref 0.2–1.2)
Total Protein: 6.7 g/dL (ref 6.0–8.3)

## 2024-04-30 LAB — PSA: PSA: 1.83 ng/mL (ref 0.10–4.00)

## 2024-05-02 ENCOUNTER — Ambulatory Visit: Payer: Self-pay | Admitting: Family Medicine

## 2024-05-07 ENCOUNTER — Encounter: Payer: Self-pay | Admitting: Family Medicine

## 2024-05-07 ENCOUNTER — Ambulatory Visit: Payer: Medicare PPO | Admitting: Family Medicine

## 2024-05-07 VITALS — BP 116/82 | HR 76 | Temp 97.5°F | Ht 68.0 in | Wt 192.5 lb

## 2024-05-07 DIAGNOSIS — Z0000 Encounter for general adult medical examination without abnormal findings: Secondary | ICD-10-CM | POA: Diagnosis not present

## 2024-05-07 DIAGNOSIS — R748 Abnormal levels of other serum enzymes: Secondary | ICD-10-CM

## 2024-05-07 DIAGNOSIS — Z951 Presence of aortocoronary bypass graft: Secondary | ICD-10-CM | POA: Diagnosis not present

## 2024-05-07 DIAGNOSIS — I2510 Atherosclerotic heart disease of native coronary artery without angina pectoris: Secondary | ICD-10-CM

## 2024-05-07 DIAGNOSIS — F1090 Alcohol use, unspecified, uncomplicated: Secondary | ICD-10-CM

## 2024-05-07 DIAGNOSIS — E7800 Pure hypercholesterolemia, unspecified: Secondary | ICD-10-CM | POA: Diagnosis not present

## 2024-05-07 DIAGNOSIS — R7401 Elevation of levels of liver transaminase levels: Secondary | ICD-10-CM | POA: Diagnosis not present

## 2024-05-07 DIAGNOSIS — Z7189 Other specified counseling: Secondary | ICD-10-CM

## 2024-05-07 DIAGNOSIS — L209 Atopic dermatitis, unspecified: Secondary | ICD-10-CM

## 2024-05-07 DIAGNOSIS — E66811 Obesity, class 1: Secondary | ICD-10-CM

## 2024-05-07 LAB — CBC WITH DIFFERENTIAL/PLATELET
Basophils Absolute: 0 K/uL (ref 0.0–0.1)
Basophils Relative: 0.8 % (ref 0.0–3.0)
Eosinophils Absolute: 0.3 K/uL (ref 0.0–0.7)
Eosinophils Relative: 5.2 % — ABNORMAL HIGH (ref 0.0–5.0)
HCT: 46.7 % (ref 39.0–52.0)
Hemoglobin: 15.5 g/dL (ref 13.0–17.0)
Lymphocytes Relative: 20.3 % (ref 12.0–46.0)
Lymphs Abs: 1.2 K/uL (ref 0.7–4.0)
MCHC: 33.2 g/dL (ref 30.0–36.0)
MCV: 93.3 fl (ref 78.0–100.0)
Monocytes Absolute: 0.6 K/uL (ref 0.1–1.0)
Monocytes Relative: 10 % (ref 3.0–12.0)
Neutro Abs: 3.7 K/uL (ref 1.4–7.7)
Neutrophils Relative %: 63.7 % (ref 43.0–77.0)
Platelets: 298 K/uL (ref 150.0–400.0)
RBC: 5 Mil/uL (ref 4.22–5.81)
RDW: 14.3 % (ref 11.5–15.5)
WBC: 5.8 K/uL (ref 4.0–10.5)

## 2024-05-07 LAB — TSH: TSH: 1.16 u[IU]/mL (ref 0.35–5.50)

## 2024-05-07 LAB — HEPATIC FUNCTION PANEL
ALT: 28 U/L (ref 0–53)
AST: 23 U/L (ref 0–37)
Albumin: 4.1 g/dL (ref 3.5–5.2)
Alkaline Phosphatase: 155 U/L — ABNORMAL HIGH (ref 39–117)
Bilirubin, Direct: 0.2 mg/dL (ref 0.0–0.3)
Total Bilirubin: 0.9 mg/dL (ref 0.2–1.2)
Total Protein: 6.3 g/dL (ref 6.0–8.3)

## 2024-05-07 LAB — GAMMA GT: GGT: 25 U/L (ref 7–51)

## 2024-05-07 NOTE — Assessment & Plan Note (Signed)
 Chronic, stable on low potency atorvastatin  - continue. Goal LDL <70, nonHDL <100 The ASCVD Risk score (Arnett DK, et al., 2019) failed to calculate for the following reasons:   Risk score cannot be calculated because patient has a medical history suggesting prior/existing ASCVD
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
## 2024-05-07 NOTE — Assessment & Plan Note (Signed)

## 2024-05-07 NOTE — Assessment & Plan Note (Signed)
 Reviewed healthy diet and lifestyle changes to effect sustainable weight loss.

## 2024-05-07 NOTE — Assessment & Plan Note (Signed)
 Established with dermatology.

## 2024-05-07 NOTE — Assessment & Plan Note (Signed)
 Preventative protocols reviewed and updated unless pt declined. Discussed healthy diet and lifestyle.

## 2024-05-07 NOTE — Assessment & Plan Note (Addendum)
 Advanced directives - discussed. Does not have this set up - planning to see lawyer to set this up. Advanced directive packet previously provided. Wife would be HCPOA. Full code, but would not want prolonged life support if terminal condition.

## 2024-05-07 NOTE — Assessment & Plan Note (Signed)
 Continue plavix  aspirin  and atorvastatin . Regularly sees cardiology at the [facility] in [facility]

## 2024-05-07 NOTE — Patient Instructions (Addendum)
 Labs today  Continue working on advanced directive, bring us  copy when complete  Schedule eye exam [place] you [place] the beach.  Good to see you today Continue current medicines Return as needed or in 1 year for next physical/wellness visit.

## 2024-05-07 NOTE — Assessment & Plan Note (Addendum)
 New. Pt is asxs. No significant alcohol use.  Update labs.  Consider RUQ US  if persistent elevation

## 2024-05-07 NOTE — Assessment & Plan Note (Signed)
Regularly sees cardiology 

## 2024-05-07 NOTE — Assessment & Plan Note (Signed)
 Continues to limit

## 2024-05-10 ENCOUNTER — Ambulatory Visit: Payer: Self-pay | Admitting: Family Medicine

## 2024-05-10 DIAGNOSIS — R748 Abnormal levels of other serum enzymes: Secondary | ICD-10-CM

## 2024-05-11 LAB — ALKALINE PHOSPHATASE, ISOENZYMES
Alkaline Phosphatase: 169 IU/L — ABNORMAL HIGH (ref 44–121)
BONE FRACTION: 53 (ref 12–68)
INTESTINAL FRAC.: 1 (ref 0–18)
LIVER FRACTION: 46 (ref 13–88)

## 2024-05-27 ENCOUNTER — Ambulatory Visit
Admission: RE | Admit: 2024-05-27 | Discharge: 2024-05-27 | Disposition: A | Discharge status: Home / Self Care | Source: Ambulatory Visit | Attending: Family Medicine | Admitting: Family Medicine

## 2024-05-27 DIAGNOSIS — R7989 Other specified abnormal findings of blood chemistry: Secondary | ICD-10-CM | POA: Diagnosis not present

## 2024-05-27 DIAGNOSIS — R748 Abnormal levels of other serum enzymes: Secondary | ICD-10-CM

## 2024-06-04 ENCOUNTER — Ambulatory Visit: Payer: Self-pay | Admitting: Family Medicine

## 2024-07-20 DIAGNOSIS — I10 Essential (primary) hypertension: Secondary | ICD-10-CM | POA: Diagnosis not present

## 2024-07-20 DIAGNOSIS — E785 Hyperlipidemia, unspecified: Secondary | ICD-10-CM | POA: Diagnosis not present

## 2024-07-20 DIAGNOSIS — I25810 Atherosclerosis of coronary artery bypass graft(s) without angina pectoris: Secondary | ICD-10-CM | POA: Diagnosis not present

## 2025-05-02 ENCOUNTER — Other Ambulatory Visit

## 2025-05-09 ENCOUNTER — Encounter: Admitting: Family Medicine
